# Patient Record
Sex: Female | Born: 1995
Health system: Southern US, Community
[De-identification: ages and names within clinical notes are randomized; demographics above are authoritative.]

## PROBLEM LIST (undated history)

## (undated) DIAGNOSIS — R011 Cardiac murmur, unspecified: Secondary | ICD-10-CM

## (undated) DIAGNOSIS — I498 Other specified cardiac arrhythmias: Secondary | ICD-10-CM

## (undated) DIAGNOSIS — Q898 Other specified congenital malformations: Secondary | ICD-10-CM

## (undated) DIAGNOSIS — Q870 Congenital malformation syndromes predominantly affecting facial appearance: Secondary | ICD-10-CM

## (undated) DIAGNOSIS — N12 Tubulo-interstitial nephritis, not specified as acute or chronic: Secondary | ICD-10-CM

## (undated) DIAGNOSIS — F419 Anxiety disorder, unspecified: Secondary | ICD-10-CM

## (undated) DIAGNOSIS — G901 Familial dysautonomia [Riley-Day]: Secondary | ICD-10-CM

## (undated) DIAGNOSIS — J189 Pneumonia, unspecified organism: Secondary | ICD-10-CM

## (undated) DIAGNOSIS — R55 Syncope and collapse: Secondary | ICD-10-CM

## (undated) DIAGNOSIS — H547 Unspecified visual loss: Secondary | ICD-10-CM

## (undated) DIAGNOSIS — R112 Nausea with vomiting, unspecified: Secondary | ICD-10-CM

## (undated) DIAGNOSIS — H9190 Unspecified hearing loss, unspecified ear: Secondary | ICD-10-CM

## (undated) DIAGNOSIS — G90A Postural orthostatic tachycardia syndrome (POTS): Secondary | ICD-10-CM

## (undated) DIAGNOSIS — R51 Headache: Secondary | ICD-10-CM

## (undated) DIAGNOSIS — G43909 Migraine, unspecified, not intractable, without status migrainosus: Secondary | ICD-10-CM

## (undated) DIAGNOSIS — R519 Headache, unspecified: Secondary | ICD-10-CM

## (undated) DIAGNOSIS — Z9889 Other specified postprocedural states: Secondary | ICD-10-CM

## (undated) DIAGNOSIS — M1990 Unspecified osteoarthritis, unspecified site: Secondary | ICD-10-CM

## (undated) HISTORY — DX: Familial dysautonomia (riley-day): G90.1

## (undated) HISTORY — DX: Postural orthostatic tachycardia syndrome (POTS): G90.A

## (undated) HISTORY — DX: Other specified cardiac arrhythmias: I49.8

## (undated) HISTORY — PX: TYMPANOSTOMY TUBE PLACEMENT: SHX32

## (undated) HISTORY — PX: CLEFT PALATE REPAIR: SUR1165

## (undated) HISTORY — DX: Unspecified visual loss: H54.7

## (undated) HISTORY — DX: Unspecified hearing loss, unspecified ear: H91.90

## (undated) HISTORY — DX: Syncope and collapse: R55

---
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
## 2016-03-17 DIAGNOSIS — I77810 Thoracic aortic ectasia: Secondary | ICD-10-CM | POA: Diagnosis not present

## 2016-03-17 DIAGNOSIS — Z3A31 31 weeks gestation of pregnancy: Secondary | ICD-10-CM | POA: Diagnosis not present

## 2016-03-17 DIAGNOSIS — Z3A32 32 weeks gestation of pregnancy: Secondary | ICD-10-CM | POA: Diagnosis not present

## 2016-03-17 DIAGNOSIS — O99413 Diseases of the circulatory system complicating pregnancy, third trimester: Secondary | ICD-10-CM | POA: Diagnosis not present

## 2016-03-17 DIAGNOSIS — D649 Anemia, unspecified: Secondary | ICD-10-CM | POA: Diagnosis not present

## 2016-03-17 DIAGNOSIS — O99013 Anemia complicating pregnancy, third trimester: Secondary | ICD-10-CM | POA: Diagnosis not present

## 2016-03-17 DIAGNOSIS — O365930 Maternal care for other known or suspected poor fetal growth, third trimester, not applicable or unspecified: Secondary | ICD-10-CM | POA: Diagnosis not present

## 2016-03-17 DIAGNOSIS — O352XX0 Maternal care for (suspected) hereditary disease in fetus, not applicable or unspecified: Secondary | ICD-10-CM | POA: Diagnosis not present

## 2016-03-17 DIAGNOSIS — Q898 Other specified congenital malformations: Secondary | ICD-10-CM | POA: Diagnosis not present

## 2016-04-03 DIAGNOSIS — O352XX0 Maternal care for (suspected) hereditary disease in fetus, not applicable or unspecified: Secondary | ICD-10-CM | POA: Diagnosis not present

## 2016-04-03 DIAGNOSIS — O09299 Supervision of pregnancy with other poor reproductive or obstetric history, unspecified trimester: Secondary | ICD-10-CM | POA: Diagnosis not present

## 2016-04-03 DIAGNOSIS — O368130 Decreased fetal movements, third trimester, not applicable or unspecified: Secondary | ICD-10-CM | POA: Diagnosis not present

## 2016-04-03 DIAGNOSIS — O210 Mild hyperemesis gravidarum: Secondary | ICD-10-CM | POA: Diagnosis not present

## 2016-04-03 DIAGNOSIS — Z3A34 34 weeks gestation of pregnancy: Secondary | ICD-10-CM | POA: Diagnosis not present

## 2016-04-03 DIAGNOSIS — Q898 Other specified congenital malformations: Secondary | ICD-10-CM | POA: Diagnosis not present

## 2016-04-03 DIAGNOSIS — Z8751 Personal history of pre-term labor: Secondary | ICD-10-CM | POA: Diagnosis not present

## 2016-04-03 DIAGNOSIS — I77810 Thoracic aortic ectasia: Secondary | ICD-10-CM | POA: Diagnosis not present

## 2016-04-03 DIAGNOSIS — O99213 Obesity complicating pregnancy, third trimester: Secondary | ICD-10-CM | POA: Diagnosis not present

## 2016-04-03 DIAGNOSIS — Z3A33 33 weeks gestation of pregnancy: Secondary | ICD-10-CM | POA: Diagnosis not present

## 2016-04-08 DIAGNOSIS — Z3A35 35 weeks gestation of pregnancy: Secondary | ICD-10-CM | POA: Diagnosis not present

## 2016-04-14 DIAGNOSIS — F329 Major depressive disorder, single episode, unspecified: Secondary | ICD-10-CM | POA: Diagnosis not present

## 2016-04-14 DIAGNOSIS — I77810 Thoracic aortic ectasia: Secondary | ICD-10-CM | POA: Diagnosis not present

## 2016-04-14 DIAGNOSIS — O365930 Maternal care for other known or suspected poor fetal growth, third trimester, not applicable or unspecified: Secondary | ICD-10-CM | POA: Diagnosis not present

## 2016-04-14 DIAGNOSIS — O09299 Supervision of pregnancy with other poor reproductive or obstetric history, unspecified trimester: Secondary | ICD-10-CM | POA: Diagnosis not present

## 2016-04-14 DIAGNOSIS — O99343 Other mental disorders complicating pregnancy, third trimester: Secondary | ICD-10-CM | POA: Diagnosis not present

## 2016-04-14 DIAGNOSIS — Z3A36 36 weeks gestation of pregnancy: Secondary | ICD-10-CM | POA: Diagnosis not present

## 2016-04-14 DIAGNOSIS — Q898 Other specified congenital malformations: Secondary | ICD-10-CM | POA: Diagnosis not present

## 2016-04-14 DIAGNOSIS — O0993 Supervision of high risk pregnancy, unspecified, third trimester: Secondary | ICD-10-CM | POA: Diagnosis not present

## 2016-04-14 DIAGNOSIS — Z8751 Personal history of pre-term labor: Secondary | ICD-10-CM | POA: Diagnosis not present

## 2016-04-18 DIAGNOSIS — Z3490 Encounter for supervision of normal pregnancy, unspecified, unspecified trimester: Secondary | ICD-10-CM | POA: Diagnosis not present

## 2016-04-18 DIAGNOSIS — Z3A36 36 weeks gestation of pregnancy: Secondary | ICD-10-CM | POA: Diagnosis not present

## 2016-04-18 DIAGNOSIS — O4703 False labor before 37 completed weeks of gestation, third trimester: Secondary | ICD-10-CM | POA: Diagnosis not present

## 2016-04-18 DIAGNOSIS — O219 Vomiting of pregnancy, unspecified: Secondary | ICD-10-CM | POA: Diagnosis not present

## 2016-04-18 DIAGNOSIS — R000 Tachycardia, unspecified: Secondary | ICD-10-CM | POA: Diagnosis not present

## 2016-04-18 DIAGNOSIS — O9989 Other specified diseases and conditions complicating pregnancy, childbirth and the puerperium: Secondary | ICD-10-CM | POA: Diagnosis not present

## 2016-04-25 DIAGNOSIS — Q898 Other specified congenital malformations: Secondary | ICD-10-CM | POA: Diagnosis not present

## 2016-04-25 DIAGNOSIS — Z3A38 38 weeks gestation of pregnancy: Secondary | ICD-10-CM | POA: Diagnosis not present

## 2016-04-25 DIAGNOSIS — O358XX0 Maternal care for other (suspected) fetal abnormality and damage, not applicable or unspecified: Secondary | ICD-10-CM | POA: Diagnosis not present

## 2016-04-25 DIAGNOSIS — I77810 Thoracic aortic ectasia: Secondary | ICD-10-CM | POA: Diagnosis not present

## 2016-04-25 DIAGNOSIS — O9989 Other specified diseases and conditions complicating pregnancy, childbirth and the puerperium: Secondary | ICD-10-CM | POA: Diagnosis not present

## 2016-04-25 DIAGNOSIS — Z3A37 37 weeks gestation of pregnancy: Secondary | ICD-10-CM | POA: Diagnosis not present

## 2016-04-25 DIAGNOSIS — M546 Pain in thoracic spine: Secondary | ICD-10-CM | POA: Diagnosis not present

## 2016-04-25 DIAGNOSIS — O9942 Diseases of the circulatory system complicating childbirth: Secondary | ICD-10-CM | POA: Diagnosis not present

## 2016-06-05 DIAGNOSIS — H6692 Otitis media, unspecified, left ear: Secondary | ICD-10-CM | POA: Diagnosis not present

## 2016-06-05 DIAGNOSIS — Z6835 Body mass index (BMI) 35.0-35.9, adult: Secondary | ICD-10-CM | POA: Diagnosis not present

## 2016-06-05 DIAGNOSIS — N61 Mastitis without abscess: Secondary | ICD-10-CM | POA: Diagnosis not present

## 2016-06-06 DIAGNOSIS — N61 Mastitis without abscess: Secondary | ICD-10-CM | POA: Diagnosis not present

## 2016-06-11 HISTORY — PX: INTRAUTERINE DEVICE INSERTION: SHX323

## 2016-06-12 DIAGNOSIS — H35413 Lattice degeneration of retina, bilateral: Secondary | ICD-10-CM | POA: Diagnosis not present

## 2016-06-12 DIAGNOSIS — Q159 Congenital malformation of eye, unspecified: Secondary | ICD-10-CM | POA: Diagnosis not present

## 2016-06-12 DIAGNOSIS — Q898 Other specified congenital malformations: Secondary | ICD-10-CM | POA: Diagnosis not present

## 2016-06-20 DIAGNOSIS — Z30430 Encounter for insertion of intrauterine contraceptive device: Secondary | ICD-10-CM | POA: Diagnosis not present

## 2016-06-27 DIAGNOSIS — Z6836 Body mass index (BMI) 36.0-36.9, adult: Secondary | ICD-10-CM | POA: Diagnosis not present

## 2016-06-27 DIAGNOSIS — N390 Urinary tract infection, site not specified: Secondary | ICD-10-CM | POA: Diagnosis not present

## 2016-06-28 ENCOUNTER — Encounter (HOSPITAL_COMMUNITY): Payer: Self-pay | Admitting: *Deleted

## 2016-06-28 DIAGNOSIS — A409 Streptococcal sepsis, unspecified: Principal | ICD-10-CM | POA: Diagnosis present

## 2016-06-28 DIAGNOSIS — Z91018 Allergy to other foods: Secondary | ICD-10-CM

## 2016-06-28 DIAGNOSIS — R109 Unspecified abdominal pain: Secondary | ICD-10-CM | POA: Diagnosis not present

## 2016-06-28 DIAGNOSIS — N12 Tubulo-interstitial nephritis, not specified as acute or chronic: Secondary | ICD-10-CM | POA: Diagnosis not present

## 2016-06-28 DIAGNOSIS — D649 Anemia, unspecified: Secondary | ICD-10-CM | POA: Diagnosis present

## 2016-06-28 DIAGNOSIS — Z87730 Personal history of (corrected) cleft lip and palate: Secondary | ICD-10-CM | POA: Diagnosis not present

## 2016-06-28 DIAGNOSIS — Z9102 Food additives allergy status: Secondary | ICD-10-CM | POA: Diagnosis not present

## 2016-06-28 DIAGNOSIS — Z975 Presence of (intrauterine) contraceptive device: Secondary | ICD-10-CM

## 2016-06-28 DIAGNOSIS — R1030 Lower abdominal pain, unspecified: Secondary | ICD-10-CM | POA: Diagnosis not present

## 2016-06-28 DIAGNOSIS — A419 Sepsis, unspecified organism: Secondary | ICD-10-CM | POA: Diagnosis not present

## 2016-06-28 DIAGNOSIS — N10 Acute tubulo-interstitial nephritis: Secondary | ICD-10-CM | POA: Diagnosis not present

## 2016-06-28 NOTE — ED Triage Notes (Signed)
Patient presents stating she is 9 months post partum and has been having lower abd pain for 2 weeks.  Seen by MD yesterday and dx with UTI.  Today started with fever, strong smelling urine.  SO reports she was not acting right today and talking out of her mind at times.  Also had an IUD placed a couple of weeks ago

## 2016-06-29 ENCOUNTER — Emergency Department (HOSPITAL_COMMUNITY): Payer: 59

## 2016-06-29 ENCOUNTER — Inpatient Hospital Stay (HOSPITAL_COMMUNITY)
Admission: EM | Admit: 2016-06-29 | Discharge: 2016-07-03 | DRG: 872 | Disposition: A | Discharge status: Home / Self Care | Payer: 59 | Attending: Internal Medicine | Admitting: Internal Medicine

## 2016-06-29 ENCOUNTER — Encounter (HOSPITAL_COMMUNITY): Payer: Self-pay | Admitting: Radiology

## 2016-06-29 DIAGNOSIS — A419 Sepsis, unspecified organism: Secondary | ICD-10-CM

## 2016-06-29 DIAGNOSIS — N10 Acute tubulo-interstitial nephritis: Secondary | ICD-10-CM | POA: Diagnosis present

## 2016-06-29 DIAGNOSIS — R109 Unspecified abdominal pain: Secondary | ICD-10-CM

## 2016-06-29 DIAGNOSIS — N12 Tubulo-interstitial nephritis, not specified as acute or chronic: Secondary | ICD-10-CM | POA: Diagnosis present

## 2016-06-29 DIAGNOSIS — A409 Streptococcal sepsis, unspecified: Secondary | ICD-10-CM

## 2016-06-29 HISTORY — DX: Migraine, unspecified, not intractable, without status migrainosus: G43.909

## 2016-06-29 HISTORY — DX: Tubulo-interstitial nephritis, not specified as acute or chronic: N12

## 2016-06-29 HISTORY — DX: Headache, unspecified: R51.9

## 2016-06-29 HISTORY — DX: Anxiety disorder, unspecified: F41.9

## 2016-06-29 HISTORY — DX: Pneumonia, unspecified organism: J18.9

## 2016-06-29 HISTORY — DX: Other specified postprocedural states: Z98.890

## 2016-06-29 HISTORY — DX: Nausea with vomiting, unspecified: R11.2

## 2016-06-29 HISTORY — DX: Unspecified osteoarthritis, unspecified site: M19.90

## 2016-06-29 HISTORY — DX: Other specified congenital malformations: Q89.8

## 2016-06-29 HISTORY — DX: Headache: R51

## 2016-06-29 LAB — URINALYSIS, ROUTINE W REFLEX MICROSCOPIC
Bacteria, UA: NONE SEEN
Bilirubin Urine: NEGATIVE
GLUCOSE, UA: NEGATIVE mg/dL
Hgb urine dipstick: NEGATIVE
KETONES UR: NEGATIVE mg/dL
NITRITE: NEGATIVE
PH: 5 (ref 5.0–8.0)
PROTEIN: 30 mg/dL — AB
Specific Gravity, Urine: 1.016 (ref 1.005–1.030)

## 2016-06-29 LAB — CBC WITH DIFFERENTIAL/PLATELET
Basophils Absolute: 0 10*3/uL (ref 0.0–0.1)
Basophils Relative: 0 %
EOS PCT: 1 %
Eosinophils Absolute: 0.1 10*3/uL (ref 0.0–0.7)
HCT: 51.4 % — ABNORMAL HIGH (ref 36.0–46.0)
Hemoglobin: 16.9 g/dL — ABNORMAL HIGH (ref 12.0–15.0)
LYMPHS ABS: 2.9 10*3/uL (ref 0.7–4.0)
Lymphocytes Relative: 24 %
MCH: 27.7 pg (ref 26.0–34.0)
MCHC: 32.9 g/dL (ref 30.0–36.0)
MCV: 84.1 fL (ref 78.0–100.0)
MONOS PCT: 9 %
Monocytes Absolute: 1.1 10*3/uL — ABNORMAL HIGH (ref 0.1–1.0)
Neutro Abs: 8.2 10*3/uL — ABNORMAL HIGH (ref 1.7–7.7)
Neutrophils Relative %: 66 %
Platelets: 230 10*3/uL (ref 150–400)
RBC: 6.11 MIL/uL — AB (ref 3.87–5.11)
RDW: 19.9 % — ABNORMAL HIGH (ref 11.5–15.5)
WBC: 12.3 10*3/uL — AB (ref 4.0–10.5)

## 2016-06-29 LAB — COMPREHENSIVE METABOLIC PANEL
ALT: 21 U/L (ref 14–54)
AST: 19 U/L (ref 15–41)
Albumin: 3.9 g/dL (ref 3.5–5.0)
Alkaline Phosphatase: 72 U/L (ref 38–126)
Anion gap: 13 (ref 5–15)
BUN: 6 mg/dL (ref 6–20)
CO2: 23 mmol/L (ref 22–32)
CREATININE: 0.95 mg/dL (ref 0.44–1.00)
Calcium: 8.8 mg/dL — ABNORMAL LOW (ref 8.9–10.3)
Chloride: 100 mmol/L — ABNORMAL LOW (ref 101–111)
GFR calc non Af Amer: >60 mL/min (ref >60)
Glucose, Bld: 94 mg/dL (ref 65–99)
Potassium: 3.5 mmol/L (ref 3.5–5.1)
SODIUM: 136 mmol/L (ref 135–145)
Total Bilirubin: 0.5 mg/dL (ref 0.3–1.2)
Total Protein: 7.9 g/dL (ref 6.5–8.1)

## 2016-06-29 LAB — I-STAT TROPONIN, ED: Troponin i, poc: 0 ng/mL (ref 0.00–0.08)

## 2016-06-29 LAB — I-STAT CG4 LACTIC ACID, ED: Lactic Acid, Venous: 1.62 mmol/L (ref 0.5–1.9)

## 2016-06-29 LAB — LIPASE, BLOOD: LIPASE: 21 U/L (ref 11–51)

## 2016-06-29 LAB — PROTIME-INR
INR: 1.14
Prothrombin Time: 14.7 seconds (ref 11.4–15.2)

## 2016-06-29 LAB — PREGNANCY, URINE: Preg Test, Ur: NEGATIVE

## 2016-06-29 MED ORDER — SODIUM CHLORIDE 0.9 % IV BOLUS (SEPSIS)
1000.0000 mL | Freq: Once | INTRAVENOUS | Status: AC
  Administered 2016-06-29: 1000 mL via INTRAVENOUS

## 2016-06-29 MED ORDER — ONDANSETRON HCL 4 MG/2ML IJ SOLN
4.0000 mg | Freq: Four times a day (QID) | INTRAMUSCULAR | Status: DC | PRN
  Administered 2016-06-29 – 2016-07-01 (×2): 4 mg via INTRAVENOUS
  Filled 2016-06-29 (×2): qty 2

## 2016-06-29 MED ORDER — LEVOFLOXACIN IN D5W 750 MG/150ML IV SOLN
750.0000 mg | Freq: Once | INTRAVENOUS | Status: AC
  Administered 2016-06-29: 750 mg via INTRAVENOUS
  Filled 2016-06-29: qty 150

## 2016-06-29 MED ORDER — FENTANYL CITRATE (PF) 100 MCG/2ML IJ SOLN
25.0000 ug | Freq: Once | INTRAMUSCULAR | Status: AC
  Administered 2016-06-29: 25 ug via INTRAVENOUS
  Filled 2016-06-29: qty 2

## 2016-06-29 MED ORDER — ENSURE ENLIVE PO LIQD
237.0000 mL | Freq: Two times a day (BID) | ORAL | Status: DC
  Administered 2016-06-30: 237 mL via ORAL

## 2016-06-29 MED ORDER — SODIUM CHLORIDE 0.9 % IV SOLN
INTRAVENOUS | Status: DC
  Administered 2016-06-30 – 2016-07-02 (×3): via INTRAVENOUS
  Administered 2016-07-03: 1 mL via INTRAVENOUS

## 2016-06-29 MED ORDER — FENTANYL CITRATE (PF) 100 MCG/2ML IJ SOLN
25.0000 ug | INTRAMUSCULAR | Status: DC | PRN
  Administered 2016-06-29 – 2016-07-03 (×23): 25 ug via INTRAVENOUS
  Filled 2016-06-29 (×23): qty 2

## 2016-06-29 MED ORDER — ONDANSETRON HCL 4 MG PO TABS
4.0000 mg | ORAL_TABLET | Freq: Four times a day (QID) | ORAL | Status: DC | PRN
  Administered 2016-07-03: 4 mg via ORAL
  Filled 2016-06-29: qty 1

## 2016-06-29 MED ORDER — ACETAMINOPHEN 325 MG PO TABS
650.0000 mg | ORAL_TABLET | Freq: Once | ORAL | Status: AC
  Administered 2016-06-29: 650 mg via ORAL
  Filled 2016-06-29: qty 2

## 2016-06-29 MED ORDER — IOPAMIDOL (ISOVUE-300) INJECTION 61%
INTRAVENOUS | Status: AC
  Filled 2016-06-29: qty 100

## 2016-06-29 MED ORDER — ACETAMINOPHEN 650 MG RE SUPP: 650.0000 mg | Freq: Four times a day (QID) | RECTAL | Status: DC | PRN

## 2016-06-29 MED ORDER — PRENATAL MULTIVITAMIN CH
1.0000 | ORAL_TABLET | Freq: Every day | ORAL | Status: DC
  Administered 2016-06-29 – 2016-07-02 (×4): 1 via ORAL
  Filled 2016-06-29 (×5): qty 1

## 2016-06-29 MED ORDER — CEFEPIME HCL 2 G IJ SOLR
2.0000 g | Freq: Once | INTRAMUSCULAR | Status: DC
  Filled 2016-06-29: qty 2

## 2016-06-29 MED ORDER — ACETAMINOPHEN 325 MG PO TABS: 650.0000 mg | ORAL_TABLET | Freq: Four times a day (QID) | ORAL | Status: DC | PRN

## 2016-06-29 MED ORDER — LEVOFLOXACIN IN D5W 750 MG/150ML IV SOLN: 750.0000 mg | INTRAVENOUS | Status: DC

## 2016-06-29 MED ORDER — ENOXAPARIN SODIUM 40 MG/0.4ML ~~LOC~~ SOLN
40.0000 mg | SUBCUTANEOUS | Status: DC
  Administered 2016-06-30 – 2016-07-02 (×3): 40 mg via SUBCUTANEOUS
  Filled 2016-06-29 (×3): qty 0.4

## 2016-06-29 MED ORDER — METRONIDAZOLE IN NACL 5-0.79 MG/ML-% IV SOLN
500.0000 mg | Freq: Once | INTRAVENOUS | Status: AC
  Administered 2016-06-29: 500 mg via INTRAVENOUS
  Filled 2016-06-29: qty 100

## 2016-06-29 MED ORDER — SODIUM CHLORIDE 0.9 % IV SOLN
INTRAVENOUS | Status: DC
  Administered 2016-06-29 (×2): via INTRAVENOUS

## 2016-06-29 MED ORDER — DEXTROSE 5 % IV SOLN
1.0000 g | Freq: Three times a day (TID) | INTRAVENOUS | Status: DC
  Administered 2016-06-29 – 2016-07-02 (×9): 1 g via INTRAVENOUS
  Filled 2016-06-29 (×10): qty 1

## 2016-06-29 NOTE — Progress Notes (Signed)
fentanyl (SUBLIMAZE) injection 75 mcg wasted at sink in patient's room [phone])

## 2016-06-29 NOTE — Progress Notes (Signed)
Pharmacy Antibiotic Note  Kaitlyn Moreno is a 21 y.o. female admitted on 06/29/2016 with UTI dx yesterday and r/o abd infection.  Pharmacy has been consulted for Levaquin dosing.  Plan: Levaquin  IV q24h Will f/u micro data, renal function, and pt's clinical condition   Height:  (157.5 cm) Weight: 200 lb (90.7 kg) IBW/kg (Calculated) : 50.1  Temp (24hrs), Avg:101.5 F (38.6 C), Min:99.1 F (37.3 C), Max:104.1 F (40.1 C)   Recent Labs Lab 06/28/16 2358 06/29/16 0041  WBC 12.3*  --   CREATININE 0.95  --   LATICACIDVEN  --  1.62    Estimated Creatinine Clearance: 98.9 mL/min (by C-G formula based on SCr of 0.95 mg/dL).    Allergies  Allergen Reactions  . Strawberry Extract Swelling  . Kiwi Extract   . Red Dye Rash    Antimicrobials this admission: 4/19 Flagyl x 1 4/19 Levaquin >>   Microbiology results: 4/18 BCx x2:  4/18 UCx:    Thank you for allowing pharmacy to be a part of this patient's care.  Christoper Fabian, PharmD, BCPS Clinical pharmacist, pager [phone] 06/29/2016 3:25 AM

## 2016-06-29 NOTE — ED Notes (Signed)
Called main lab to add on lipase.  

## 2016-06-29 NOTE — ED Provider Notes (Signed)
[facility]-EMERGENCY DEPT Provider Note   CSN: [id number] Arrival date & time: 06/28/16  2330   By signing my name below, I, Kaitlyn Moreno, attest that this documentation has been prepared under the direction and in the presence of Kaitlyn Rhine, MD. Electronically signed, Kaitlyn Moreno, ED Scribe. 06/29/16. 12:57 AM.   History   Chief Complaint Chief Complaint  Patient presents with  . Abdominal Pain  . Fever   The history is provided by the patient and medical records. No language interpreter was used.  Abdominal Pain   This is a new problem. The current episode started 2 days ago. The problem occurs constantly. The problem has been gradually worsening. The pain is associated with an unknown factor. The quality of the pain is throbbing. The pain is at a severity of 8/10. The pain is moderate. Associated symptoms include fever, dysuria, frequency and headaches. Pertinent negatives include hematuria. The symptoms are aggravated by certain positions. Nothing relieves the symptoms.  Fever   Associated symptoms include headaches. Pertinent negatives include no cough.    Kaitlyn Moreno is a 21 y.o. female with h/o sticklers disease, cleft palate, murmur, pierre robin sequence, transported via private vehicle to the Emergency Department with concern for gradually worsening abdominal pain onset 2 days ago. She notes associated fever (tMax 104 measured in [facility] ED), back pain, dizziness, dysuria, frequency, flank pain and headache. She states back, flank and abdominal pain are most concerning at this time. Pt describes 8/10, constant, throbbing abdominal pain radiating into her flank and back. No other modifying factors noted. Pt diagnosed with a UTI 2 days ago and prescribed Cipro at that time. IUD placed on 06/20/2016. Pt reportedly delivered a child vaginally 2 months ago, states her child is in ICU in [place] and she is currently pumping, not breastfeeding. She denies vaginal bleeding or discharge,  cough, foreign travel, rash, h/o chest or abdominal surgeries or any other complaints at this time.   Past Medical History:  Diagnosis Date  . Murmur   . [name]   . Stickler's syndrome     There are no active problems to display for this patient.   Past Surgical History:  Procedure Laterality Date  . CLEFT PALATE REPAIR    . tubes in ears      OB History    No data available       Home Medications    Prior to Admission medications   Medication Sig Start Date End Date Taking? Authorizing Provider  ciprofloxacin (CIPRO) 500 MG tablet Take 500 mg by mouth 2 (two) times daily.   Yes Historical Provider, MD  Prenatal Vit-Fe Fumarate-FA (PRENATAL MULTIVITAMIN) TABS tablet Take 1 tablet by mouth daily at 12 noon.   Yes Historical Provider, MD  ibuprofen (ADVIL,MOTRIN) 600 MG tablet Take 1 tablet (600 mg total) by mouth every 6 (six) hours as needed. Patient not taking: Reported on 06/29/2016 03/11/13   Marcellina Millin, MD    Family History No family history on file.  Social History Social History  Substance Use Topics  . Smoking status: Never Smoker  . Smokeless tobacco: Never Used  . Alcohol use No     Allergies   Strawberry extract; Kiwi extract; and Red dye   Review of Systems Review of Systems  Constitutional: Positive for fever.  Respiratory: Negative for cough.   Gastrointestinal: Positive for abdominal pain.  Genitourinary: Positive for dysuria, flank pain and frequency. Negative for hematuria, vaginal bleeding and vaginal discharge.  Musculoskeletal:  Positive for back pain.  Neurological: Positive for headaches.  All other systems reviewed and are negative.    Physical Exam Updated Vital Signs BP 115/76 (BP Location: Left Arm)   Pulse (!) 134   Temp (!) 104.1 F (40.1 C) (Oral)   Resp 18   Ht  (1.575 m)   Wt 200 lb (90.7 kg)   LMP 06/14/2016   SpO2 97%   BMI 36.58 kg/m   Physical Exam CONSTITUTIONAL: Well developed/well  nourished HEAD: Normocephalic/atraumatic EYES: EOMI/PERRL, no icterus ENMT: Mucous membranes moist NECK: supple no meningeal signs SPINE/BACK:entire spine nontender CV: tachycardic but no loud murmurs LUNGS: Lungs are clear to auscultation bilaterally, no apparent distress ABDOMEN: soft, moderate diffuse tenderness, no rebound or guarding, bowel sounds noted throughout abdomen GU: bilateral cva tenderness NEURO: Pt is awake/alert/appropriate, moves all extremitiesx4.  No facial droop.   EXTREMITIES: pulses normal/equal, full ROM SKIN: warm, color normal, no rash PSYCH: no abnormalities of mood noted, alert and oriented to situation   ED Treatments / Results  DIAGNOSTIC STUDIES: Oxygen Saturation is 97% on RA, NL by my interpretation.    COORDINATION OF CARE: 12:42 AM-Discussed next steps with pt. Pt verbalized understanding and is agreeable with the plan. Will order labs, imaging and medications. 3:08 AM Pt requesting medications for pain. Will order fentanyl.   Labs (all labs ordered are listed, but only abnormal results are displayed) Labs Reviewed  COMPREHENSIVE METABOLIC PANEL - Abnormal; Notable for the following:       Result Value   Chloride 100 (*)    Calcium 8.8 (*)    All other components within normal limits  CBC WITH DIFFERENTIAL/PLATELET - Abnormal; Notable for the following:    WBC 12.3 (*)    RBC 6.11 (*)    Hemoglobin 16.9 (*)    HCT 51.4 (*)    RDW 19.9 (*)    Neutro Abs 8.2 (*)    Monocytes Absolute 1.1 (*)    All other components within normal limits  URINALYSIS, ROUTINE W REFLEX MICROSCOPIC - Abnormal; Notable for the following:    Color, Urine AMBER (*)    APPearance TURBID (*)    Protein, ur 30 (*)    Leukocytes, UA MODERATE (*)    Squamous Epithelial / LPF 0-5 (*)    Non Squamous Epithelial 0-5 (*)    All other components within normal limits  CULTURE, BLOOD (ROUTINE X 2)  CULTURE, BLOOD (ROUTINE X 2)  URINE CULTURE  PROTIME-INR  PREGNANCY,  URINE  LIPASE, BLOOD  I-STAT CG4 LACTIC ACID, ED  I-STAT TROPOININ, ED    EKG  EKG Interpretation None       Radiology Ct Abdomen Pelvis W Contrast  Result Date: 06/29/2016 CLINICAL DATA:  Initial evaluation for sepsis, worsening pain and fever. Recent UTI. EXAM: CT ABDOMEN AND PELVIS WITH CONTRAST TECHNIQUE: Multidetector CT imaging of the abdomen and pelvis was performed using the standard protocol following bolus administration of intravenous contrast. CONTRAST:  Under cc of Isovue-300. COMPARISON:  Prior CT from 07/23/2014. FINDINGS: Lower chest: Visualized lung bases are clear. Hepatobiliary: Liver demonstrates a normal contrast enhanced appearance. Gallbladder normal. No biliary dilatation. Pancreas: Pancreas within normal limits. Spleen: Spleen within normal limits. Adrenals/Urinary Tract: Adrenal glands are normal. Kidneys equal in size. Subtly decreased nephrogram within the right kidney as compared to the left. Parenchymal striation present at the lower pole. Subtle hazy stranding about the right renal pelvis and right ureter. Constellation of findings suspicious for upper urinary tract  infection with mild pyelonephritis. Relative sparing of the left kidney which is normal in appearance. No significant inflammatory changes about the left renal collecting system. No nephrolithiasis or hydronephrosis. No focal enhancing renal mass. Bladder partially distended without acute abnormality. Stomach/Bowel: Stomach within normal limits. No evidence for bowel obstruction. Appendix normal. No acute inflammatory changes about the bowels. Vascular/Lymphatic: Normal intravascular enhancement seen throughout the intra-abdominal aorta and its branch vessels. No pathologically enlarged intra-abdominal or pelvic lymph nodes. Reproductive: IUD in place within the uterus. Uterus and ovaries otherwise unremarkable. Other: Gy stasis of the rectus abdominis musculature with small fat containing paraumbilical  hernia. No free air or fluid. Musculoskeletal: No acute osseus abnormality. No worrisome lytic or blastic osseous lesions. IMPRESSION: 1. Subtle striation within the right renal nephrogram with hazy inflammatory stranding about the right renal collecting system. Findings concerning for acute pyelonephritis with right-sided upper urinary tract infection. Relative sparing of the left kidney and left renal collecting system at this time. 2. No other acute intra-abdominal or pelvic process. Electronically Signed   By: Rise Mu M.D.   On: 06/29/2016 02:15   Dg Chest Portable 1 View  Result Date: 06/29/2016 CLINICAL DATA:  Sepsis. EXAM: PORTABLE CHEST 1 VIEW COMPARISON:  Radiograph 12/14/2010 FINDINGS: There low lung volumes limit assessment. Streaky bibasilar atelectasis. No confluent airspace disease to suggest pneumonia. Heart is at the upper limits normal in size, likely accentuated by technique and low lung volumes. No pleural fluid. No pulmonary edema. No pneumothorax. No evidence of acute osseous abnormality. IMPRESSION: Hypoventilatory chest with streaky bibasilar atelectasis. Electronically Signed   By: Rubye Oaks M.D.   On: 06/29/2016 00:39    Procedures Procedures  CRITICAL CARE Performed by: Joya Gaskins Total critical care time: 35 minutes Critical care time was exclusive of separately billable procedures and treating other patients. Critical care was necessary to treat or prevent imminent or life-threatening deterioration. Critical care was time spent personally by me on the following activities: development of treatment plan with patient and/or surrogate as well as nursing, discussions with consultants, evaluation of patient's response to treatment, examination of patient, obtaining history from patient or surrogate, ordering and performing treatments and interventions, ordering and review of laboratory studies, ordering and review of radiographic studies, pulse oximetry  and re-evaluation of patient's condition. PATIENT WITH PYELONEPHRITIS, IV FLUIDS GIVEN, IV ANTIBIOTICS, INITIAL HEART RATE >130 WILL BE ADMITTED   Medications Ordered in ED Medications  iopamidol (ISOVUE-300) 61 % injection (not administered)  fentaNYL (SUBLIMAZE) injection 25 mcg (not administered)  sodium chloride 0.9 % bolus 1,000 mL (0 mLs Intravenous Stopped 06/29/16 0109)    And  sodium chloride 0.9 % bolus 1,000 mL (0 mLs Intravenous Stopped 06/29/16 0224)    And  sodium chloride 0.9 % bolus 1,000 mL (1,000 mLs Intravenous New Bag/Given 06/29/16 0224)  levofloxacin (LEVAQUIN) IVPB 750 mg (0 mg Intravenous Stopped 06/29/16 0224)  metroNIDAZOLE (FLAGYL) IVPB 500 mg (0 mg Intravenous Stopped 06/29/16 0224)  acetaminophen (TYLENOL) tablet 650 mg (650 mg Oral Given 06/29/16 0111)  fentaNYL (SUBLIMAZE) injection 25 mcg (25 mcg Intravenous Given 06/29/16 0131)     Initial Impression / Assessment and Plan / ED Course  I have reviewed the triage vital signs and the nursing notes.  Pertinent labs & imaging results that were available during my care of the patient were reviewed by me and considered in my medical decision making (see chart for details).     1:07 AM Pt 2 months postpartum here with abd pain  Recent diagnosis  Of UTI, just started antibiotics However she had IUD placed earlier this month Pt will need CT imaging 3:19 AM PT IMPROVED CT IMAGING REVEALS PYELONEPHRITIS BUT NO OTHER ACUTE FINDINGS DUE TO HER ILL APPEARANCE ON ARRIVAL, WILL ADMIT FOR MONITORING PT AGREEABLE D/W DR Julian Reil FOR ADMISSION Final Clinical Impressions(s) / ED Diagnoses   Final diagnoses:  Pyelonephritis    New Prescriptions New Prescriptions   No medications on file  I personally performed the services described in this documentation, which was scribed in my presence. The recorded information has been reviewed and is accurate.        Kaitlyn Rhine, MD 06/29/16 [phone]

## 2016-06-29 NOTE — Care Management Note (Signed)
Case Management Note  Patient Details  Name: RADA ZEGERS MRN: [id number] Date of Birth: 1995/10/21  Subjective/Objective:                    Action/Plan:   Expected Discharge Date:                  Expected Discharge Plan:  Home/Self Care  In-House Referral:     Discharge planning Services     Post Acute Care Choice:    Choice offered to:     DME Arranged:    DME Agency:     HH Arranged:    HH Agency:     Status of Service:  In process, will continue to follow  If discussed at Long Length of Stay Meetings, dates discussed:    Additional Comments:  Kingsley Plan, RN 06/29/2016, 2:46 PM

## 2016-06-29 NOTE — Progress Notes (Signed)
Pharmacy Antibiotic Note  Kaitlyn Moreno is a 21 y.o. female admitted on 06/29/2016 with abdominal pain and fever. Pharmacy has been consulted to transition patient from Levaquin to cefepime for pyelonephritis/UTI.  Baseline labs reviewed.   Plan: - Cefepime 1gm IV Q8H - Monitor renal fxn, micro data, clinical progress to de-escalate abx   Height:  (157.5 cm) Weight: 200 lb (90.7 kg) IBW/kg (Calculated) : 50.1  Temp (24hrs), Avg:100.6 F (38.1 C), Min:98 F (36.7 C), Max:104.1 F (40.1 C)   Recent Labs Lab 06/28/16 2358 06/29/16 0041  WBC 12.3*  --   CREATININE 0.95  --   LATICACIDVEN  --  1.62    Estimated Creatinine Clearance: 98.9 mL/min (by C-G formula based on SCr of 0.95 mg/dL).    Allergies  Allergen Reactions  . Strawberry Extract Swelling  . Kiwi Extract   . Red Dye Rash    LVQ 4/19 >> 4/19 Cefepime 4/19 >>  4/18 BCx x2 - 4/18 UCx -     Chidiebere Wynn D. Laney Potash, PharmD, BCPS Pager:  [phone] 06/29/2016, 10:12 AM

## 2016-06-29 NOTE — H&P (Addendum)
History and Physical    Kaitlyn Moreno VHQ:[id number] DOB: 1995/04/15 DOA: 06/29/2016  PCP: No PCP Per Patient Patient coming from: Home  Chief Complaint: abdominal pain and fever  HPI: Kaitlyn Moreno is a 21 y.o. female with medical history significant of Polo Riley Sequence, Stickler's syndrome presenting w/ abdominal pain and fevers. Started 2-3 days ago. Initially intermittent but now constant. Associated with flank pain, dizziness, dysuria, frequency and headaches. Pain is described as throbbing and achy. Patient states she was diagnosed with a UTI approximately 2 days ago and was started on ciprofloxacin. Edition she had an IUD placed on 06/20/2016 patient is status post spontaneous vaginal delivery 2 months ago. Child is currently in the ICU at [facility]. Patient denies any vaginal bleeding or discharge, cough, shortness of breath, chest pain, palpitations, rash, diarrhea, constipation.  Additionally patient endorses confusion she states she has very little memory of the events over the course of the evening and her time here in the emergency room. Patient states she was told by her husband that she was very confused and saying odd things. Currently her mental status is at baseline  ED Course: objective findings outlined below. Sepsis protocol initiated with fluids and antibiotics  Review of Systems: As per HPI otherwise 10 point review of systems negative.   Ambulatory Status: no restrictions  Past Medical History:  Diagnosis Date  . Murmur   . Polo Riley sequence   . Stickler's syndrome     Past Surgical History:  Procedure Laterality Date  . CLEFT PALATE REPAIR    . tubes in ears      Social History   Social History  . Marital status: Single    Spouse name: N/A  . Number of children: N/A  . Years of education: N/A   Occupational History  . Not on file.   Social History Main Topics  . Smoking status: Never Smoker  . Smokeless tobacco: Never Used  . Alcohol  use No  . Drug use: No  . Sexual activity: Yes    Birth control/ protection: Other-see comments     Comment: IUD placed   Other Topics Concern  . Not on file   Social History Narrative  . No narrative on file    Allergies  Allergen Reactions  . Strawberry Extract Swelling  . Kiwi Extract   . Red Dye Rash    Family History  Problem Relation Age of Onset  . Diabetes Mother   . Stickler syndrome Father     Prior to Admission medications   Medication Sig Start Date End Date Taking? Authorizing Provider  ciprofloxacin (CIPRO) 500 MG tablet Take 500 mg by mouth 2 (two) times daily.   Yes Historical Provider, MD  Prenatal Vit-Fe Fumarate-FA (PRENATAL MULTIVITAMIN) TABS tablet Take 1 tablet by mouth daily at 12 noon.   Yes Historical Provider, MD  ibuprofen (ADVIL,MOTRIN) 600 MG tablet Take 1 tablet (600 mg total) by mouth every 6 (six) hours as needed. Patient not taking: Reported on 06/29/2016 03/11/13   Isaac Bliss, MD    Physical Exam: Vitals:   06/29/16 0600 06/29/16 0739 06/29/16 0800 06/29/16 0938  BP: 98/64 95/72 101/64 104/60  Pulse: 74 69 75 84  Resp: _0 Temp:    98 F (36.7 C)  TempSrc:    Oral  SpO2: 97% 100% 100% 100%  Weight:      Height:         General:  Appears calm and  comfortable Eyes:  PERRL, EOMI, normal lids, iris ENT:  grossly normal hearing, lips & tongue, mmm Neck:  no LAD, masses or thyromegaly Cardiovascular:  RRR, no m/r/g. No LE edema.  Respiratory:  CTA bilaterally, no w/r/r. Normal respiratory effort. Abdomen: Mild discomfort on palpation especially in suprapubic region, NABS Skin:  no rash or induration seen on limited exam Musculoskeletal:  grossly normal tone BUE/BLE, good ROM, no bony abnormality Psychiatric:  grossly normal mood and affect, speech fluent and appropriate, AOx3 Neurologic:  CN 2-12 grossly intact, moves all extremities in coordinated fashion, sensation intact  Labs on Admission: I have personally reviewed  following labs and imaging studies  CBC:  Recent Labs Lab 06/28/16 2358  WBC 12.3*  NEUTROABS 8.2*  HGB 16.9*  HCT 51.4*  MCV 84.1  PLT 329   Basic Metabolic Panel:  Recent Labs Lab 06/28/16 2358  NA 136  K 3.5  CL 100*  CO2 23  GLUCOSE 94  BUN 6  CREATININE 0.95  CALCIUM 8.8*   GFR: Estimated Creatinine Clearance: 98.9 mL/min (by C-G formula based on SCr of 0.95 mg/dL). Liver Function Tests:  Recent Labs Lab 06/28/16 2358  AST 19  ALT 21  ALKPHOS 72  BILITOT 0.5  PROT 7.9  ALBUMIN 3.9    Recent Labs Lab 06/28/16 2358  LIPASE 21   No results for input(s): AMMONIA in the last 168 hours. Coagulation Profile:  Recent Labs Lab 06/28/16 2358  INR 1.14   Cardiac Enzymes: No results for input(s): CKTOTAL, CKMB, CKMBINDEX, TROPONINI in the last 168 hours. BNP (last 3 results) No results for input(s): PROBNP in the last 8760 hours. HbA1C: No results for input(s): HGBA1C in the last 72 hours. CBG: No results for input(s): GLUCAP in the last 168 hours. Lipid Profile: No results for input(s): CHOL, HDL, LDLCALC, TRIG, CHOLHDL, LDLDIRECT in the last 72 hours. Thyroid Function Tests: No results for input(s): TSH, T4TOTAL, FREET4, T3FREE, THYROIDAB in the last 72 hours. Anemia Panel: No results for input(s): VITAMINB12, FOLATE, FERRITIN, TIBC, IRON, RETICCTPCT in the last 72 hours. Urine analysis:    Component Value Date/Time   COLORURINE AMBER (A) 06/28/2016 2359   APPEARANCEUR TURBID (A) 06/28/2016 2359   LABSPEC 1.016 06/28/2016 2359   PHURINE 5.0 06/28/2016 2359   GLUCOSEU NEGATIVE 06/28/2016 2359   HGBUR NEGATIVE 06/28/2016 2359   BILIRUBINUR NEGATIVE 06/28/2016 [place] 06/28/2016 2359   PROTEINUR 30 (A) 06/28/2016 2359   NITRITE NEGATIVE 06/28/2016 2359   LEUKOCYTESUR MODERATE (A) 06/28/2016 2359    Creatinine Clearance: Estimated Creatinine Clearance: 98.9 mL/min (by C-G formula based on SCr of 0.95 mg/dL).  Sepsis  Labs: _0 (procalcitonin:4,lacticidven:4) )No results found for this or any previous visit (from the past 240 hour(s)).   Radiological Exams on Admission: Ct Abdomen Pelvis W Contrast  Result Date: 06/29/2016 CLINICAL DATA:  Initial evaluation for sepsis, worsening pain and fever. Recent UTI. EXAM: CT ABDOMEN AND PELVIS WITH CONTRAST TECHNIQUE: Multidetector CT imaging of the abdomen and pelvis was performed using the standard protocol following bolus administration of intravenous contrast. CONTRAST:  Under cc of Isovue-300. COMPARISON:  Prior CT from 07/23/2014. FINDINGS: Lower chest: Visualized lung bases are clear. Hepatobiliary: Liver demonstrates a normal contrast enhanced appearance. Gallbladder normal. No biliary dilatation. Pancreas: Pancreas within normal limits. Spleen: Spleen within normal limits. Adrenals/Urinary Tract: Adrenal glands are normal. Kidneys equal in size. Subtly decreased nephrogram within the right kidney as compared to the left. Parenchymal striation present at the lower pole.  Subtle hazy stranding about the right renal pelvis and right ureter. Constellation of findings suspicious for upper urinary tract infection with mild pyelonephritis. Relative sparing of the left kidney which is normal in appearance. No significant inflammatory changes about the left renal collecting system. No nephrolithiasis or hydronephrosis. No focal enhancing renal mass. Bladder partially distended without acute abnormality. Stomach/Bowel: Stomach within normal limits. No evidence for bowel obstruction. Appendix normal. No acute inflammatory changes about the bowels. Vascular/Lymphatic: Normal intravascular enhancement seen throughout the intra-abdominal aorta and its branch vessels. No pathologically enlarged intra-abdominal or pelvic lymph nodes. Reproductive: IUD in place within the uterus. Uterus and ovaries otherwise unremarkable. Other: Gy stasis of the rectus abdominis musculature with small  fat containing paraumbilical hernia. No free air or fluid. Musculoskeletal: No acute osseus abnormality. No worrisome lytic or blastic osseous lesions. IMPRESSION: 1. Subtle striation within the right renal nephrogram with hazy inflammatory stranding about the right renal collecting system. Findings concerning for acute pyelonephritis with right-sided upper urinary tract infection. Relative sparing of the left kidney and left renal collecting system at this time. 2. No other acute intra-abdominal or pelvic process. Electronically Signed   By: Jeannine Boga M.D.   On: 06/29/2016 02:15   Dg Chest Portable 1 View  Result Date: 06/29/2016 CLINICAL DATA:  Sepsis. EXAM: PORTABLE CHEST 1 VIEW COMPARISON:  Radiograph 12/14/2010 FINDINGS: There low lung volumes limit assessment. Streaky bibasilar atelectasis. No confluent airspace disease to suggest pneumonia. Heart is at the upper limits normal in size, likely accentuated by technique and low lung volumes. No pleural fluid. No pulmonary edema. No pneumothorax. No evidence of acute osseous abnormality. IMPRESSION: Hypoventilatory chest with streaky bibasilar atelectasis. Electronically Signed   By: Jeb Levering M.D.   On: 06/29/2016 00:39      Assessment/Plan Principal Problem:   Pyelonephritis Active Problems:   Streptococcal sepsis, unspecified ([place])   Postpartum state   Abdominal pain   Sepsis ([place])   Pyelonephritis: Treated for UTI on cipro 2 days prior to admission.  CVA tenderness present. - change to cefepime until Cx/sensitivities return. Unfortunately may come back negative as pt on cipro at time of Cx collection - IVF  Sepsis: secondary to the above. Currently resolved. Pt initially met sepsis criteria w/ AMS, WBC 12.3, temperature 104.1, tachycardic, tachypnea, with floridly positive UA. Currently patient mentation is at baseline and vital signs have improved significantly after IV antibiotics and IV fluids. - Treatment as above.    - f/u [facility]  Lactating Mother: Pt is 57mopostpartum. SVD at 38 wks. Pts [facility] Mother continuing to pump breast milk - Pump to room - Continue PNV  Abdominal pain: Likely from pyelo. Placement of IUD 1 wk ago l;ikely not contributing as no vaginal sx and CT shows proper placement of IUD. Much improved since time of admission - treatment as above.    DVT prophylaxis: Lovenox (pt is currently in a hypercoagulable state and at higher risk of DVT) Code Status: FULL  Family Communication: none  Disposition Plan: pending improvement and [place]  Admission status: observation    Donyetta Ogletree J MD [facility]  If 7PM-7AM, please contact night-coverage [email or web address] Password [email or web address] 06/29/2016, 9:41 AM

## 2016-06-29 NOTE — Progress Notes (Signed)
On call MD notified via Amion regarding patient's elevated temperature 101.76F. Patient encourage to use incentive spirometer.

## 2016-06-30 DIAGNOSIS — N12 Tubulo-interstitial nephritis, not specified as acute or chronic: Secondary | ICD-10-CM

## 2016-06-30 DIAGNOSIS — Z975 Presence of (intrauterine) contraceptive device: Secondary | ICD-10-CM | POA: Diagnosis not present

## 2016-06-30 DIAGNOSIS — N10 Acute tubulo-interstitial nephritis: Secondary | ICD-10-CM | POA: Diagnosis not present

## 2016-06-30 DIAGNOSIS — R1030 Lower abdominal pain, unspecified: Secondary | ICD-10-CM | POA: Diagnosis not present

## 2016-06-30 DIAGNOSIS — Z87730 Personal history of (corrected) cleft lip and palate: Secondary | ICD-10-CM | POA: Diagnosis not present

## 2016-06-30 DIAGNOSIS — A409 Streptococcal sepsis, unspecified: Secondary | ICD-10-CM | POA: Diagnosis present

## 2016-06-30 DIAGNOSIS — Z91018 Allergy to other foods: Secondary | ICD-10-CM | POA: Diagnosis not present

## 2016-06-30 DIAGNOSIS — D649 Anemia, unspecified: Secondary | ICD-10-CM | POA: Diagnosis not present

## 2016-06-30 DIAGNOSIS — Z9102 Food additives allergy status: Secondary | ICD-10-CM | POA: Diagnosis not present

## 2016-06-30 LAB — COMPREHENSIVE METABOLIC PANEL
ALBUMIN: 2.9 g/dL — AB (ref 3.5–5.0)
ALT: 20 U/L (ref 14–54)
ANION GAP: 10 (ref 5–15)
AST: 16 U/L (ref 15–41)
Alkaline Phosphatase: 54 U/L (ref 38–126)
BILIRUBIN TOTAL: 0.2 mg/dL — AB (ref 0.3–1.2)
BUN: <5 mg/dL — ABNORMAL LOW (ref 6–20)
CALCIUM: 8 mg/dL — AB (ref 8.9–10.3)
CO2: 23 mmol/L (ref 22–32)
CREATININE: 0.81 mg/dL (ref 0.44–1.00)
Chloride: 104 mmol/L (ref 101–111)
GFR calc non Af Amer: >60 mL/min (ref >60)
GLUCOSE: 87 mg/dL (ref 65–99)
Potassium: 3.7 mmol/L (ref 3.5–5.1)
Sodium: 137 mmol/L (ref 135–145)
TOTAL PROTEIN: 6.1 g/dL — AB (ref 6.5–8.1)

## 2016-06-30 LAB — URINE CULTURE

## 2016-06-30 MED ORDER — SACCHAROMYCES BOULARDII 250 MG PO CAPS
250.0000 mg | ORAL_CAPSULE | Freq: Two times a day (BID) | ORAL | Status: DC
  Administered 2016-06-30 – 2016-07-03 (×5): 250 mg via ORAL
  Filled 2016-06-30 (×5): qty 1

## 2016-06-30 MED ORDER — BOOST / RESOURCE BREEZE PO LIQD
1.0000 | Freq: Two times a day (BID) | ORAL | Status: DC
  Administered 2016-06-30 – 2016-07-02 (×3): 1 via ORAL

## 2016-06-30 MED ORDER — LOPERAMIDE HCL 2 MG PO CAPS
4.0000 mg | ORAL_CAPSULE | Freq: Once | ORAL | Status: AC
  Administered 2016-06-30: 4 mg via ORAL
  Filled 2016-06-30: qty 2

## 2016-06-30 NOTE — Progress Notes (Signed)
Patient has had 3 loose stools in the past few hours. MD made aware. Immodium and Florastor to be ordered for the patient. RN will continue to monitor.

## 2016-06-30 NOTE — Progress Notes (Signed)
Initial Nutrition Assessment  DOCUMENTATION CODES:   Obesity unspecified  INTERVENTION:   -Boost Breeze po BID, each supplement provides 250 kcal and 9 grams of protein  NUTRITION DIAGNOSIS:   Increased nutrient needs related to  (lactation) as evidenced by estimated needs.  GOAL:   Patient will meet greater than or equal to 90% of their needs  MONITOR:   PO intake, Supplement acceptance, Labs, Weight trends, Skin, I & O's  REASON FOR ASSESSMENT:   Malnutrition Screening Tool    ASSESSMENT:   21 year old Caucasian female with past medical history of Sticklers syndrome presented with complaints of abdominal pain, fever, or urinary frequency. She was diagnosed with a urinary tract infection 2 days prior to hospitalization and was started on ciprofloxacin. She had an IUD placed on April 10. She is status post spontaneous vaginal delivery about 2 months ago and her son is in the ICU at [facility] since he has the same syndrome that she has. Since patient's symptoms did not improve, she was hospitalized for further management.  Pt admitted with acute pyelonephritis.   Spoke with pt, who reports poor appetite over the past two months since her son was born. She shares that she has had a lack of desire to eat. Additionally, pt shares that she suffered from hyperemesis during her pregnancy and lost 40# as a result of nausea, vomiting, and the inability to keep foods down. She reports this resulted in multiple ED visits during her pregnancy for IV fluids and anti-emetics, however, was never hospitalized during her pregnancy. Pt suspects she has lost weight over the past two months, more related to poor oral intake than post-partum.   Pt was consuming lunch at time of visit- ate 100% of sandwich. Pt reports "this is the first time I've felt like eating anything in a while". She was consuming a sandwich without difficulty. Ensure supplement at bedside- pt reports sipping on it due to its milky  consistency. Since pt is currently pumping, will add Boost Breeze supplement to assist with additional calories and protein, which pt is amenable to.   Nutrition-Focused physical exam completed. Findings are no fat depletion, no muscle depletion, and no edema.   Labs reviewed.   Diet Order:  Diet regular Room service appropriate? Yes; Fluid consistency: Thin  Skin:  Reviewed, no issues  Last BM:  06/28/16  Height:   Ht Readings from Last 1 Encounters:  06/28/16  (1.575 m)    Weight:   Wt Readings from Last 1 Encounters:  06/28/16 200 lb (90.7 kg)    Ideal Body Weight:  50 kg  BMI:  Body mass index is 36.58 kg/m.  Estimated Nutritional Needs:   Kcal:  1800-2000  Protein:  75-90 grams  Fluid:  >1.9 L  EDUCATION NEEDS:   No education needs identified at this time  Marcell Chavarin A. Mayford Knife, RD, LDN, CDE Pager: [phone] After hours Pager: [phone]

## 2016-06-30 NOTE — Progress Notes (Signed)
[facility] PROGRESS NOTE  SARYE KATH WVP:[id number] DOB: 1996/02/09 DOA: 06/29/2016  PCP: Marco Collie, MD  Brief History/Interval Summary: 21 year old Caucasian female with past medical history of Sticklers syndrome presented with complaints of abdominal pain, fever, or urinary frequency. She was diagnosed with a urinary tract infection 2 days prior to hospitalization and was started on ciprofloxacin. She had an IUD placed on April 10. She is status post spontaneous vaginal delivery about 2 months ago and her son is in the ICU at [facility] since he has the same syndrome that she has. Since patient's symptoms did not improve, she was hospitalized for further management.  Reason for Visit: Acute pyelonephritis  Consultants: None  Procedures: None  Antibiotics: Cefepime  Subjective/Interval History: Patient feels better this morning. Continues to have pain in her lower abdomen and right flank area. Denies any nausea, vomiting. Continues to have some discomfort with urination. Denies any blood in the urine.  ROS: No chest pain or shortness of breath  Objective:  Vital Signs  Vitals:   06/29/16 2200 06/30/16 0021 06/30/16 0451 06/30/16 0500  BP: 112/69  (!) 80/47 (!) 91/57  Pulse:   95   Resp:   19   Temp: (!) 101.5 F (38.6 C) 100 F (37.8 C) 98.7 F (37.1 C)   TempSrc: Oral Oral Oral   SpO2:   100%   Weight:      Height:        Intake/Output Summary (Last 24 hours) at 06/30/16 1325 Last data filed at 06/30/16 0944  Gross per 24 hour  Intake              360 ml  Output                5 ml  Net              355 ml   Filed Weights   06/28/16 2347  Weight: 90.7 kg (200 lb)    General appearance: alert, cooperative, appears stated age and no distress Resp: clear to auscultation bilaterally Cardio: regular rate and rhythm, S1, S2 normal, no murmur, click, rub or gallop GI: Abdomen is soft. Mildly tender in the left lower abdomen and the right flank area. No  rebound, rigidity or guarding. No masses or organomegaly.  Extremities: extremities normal, atraumatic, no cyanosis or edema Neurologic: No focal neurologic deficits.  Lab Results:  Data Reviewed: I have personally reviewed following labs and imaging studies  CBC:  Recent Labs Lab 06/28/16 2358  WBC 12.3*  NEUTROABS 8.2*  HGB 16.9*  HCT 51.4*  MCV 84.1  PLT 546    Basic Metabolic Panel:  Recent Labs Lab 06/28/16 2358 06/30/16 0551  NA 136 137  K 3.5 3.7  CL 100* 104  CO2 23 23  GLUCOSE 94 87  BUN 6 <5*  CREATININE 0.95 0.81  CALCIUM 8.8* 8.0*    GFR: Estimated Creatinine Clearance: 116 mL/min (by C-G formula based on SCr of 0.81 mg/dL).  Liver Function Tests:  Recent Labs Lab 06/28/16 2358 06/30/16 0551  AST 19 16  ALT 21 20  ALKPHOS 72 54  BILITOT 0.5 0.2*  PROT 7.9 6.1*  ALBUMIN 3.9 2.9*     Recent Labs Lab 06/28/16 2358  LIPASE 21   Coagulation Profile:  Recent Labs Lab 06/28/16 2358  INR 1.14     Recent Results (from the past 240 hour(s))  Urine culture     Status: Abnormal   Collection Time: 06/28/16 11:59  PM  Result Value Ref Range Status   Specimen Description URINE, RANDOM  Final   Special Requests NONE  Final   Culture MULTIPLE SPECIES PRESENT, SUGGEST RECOLLECTION (A)  Final   Report Status 06/30/2016 FINAL  Final      Radiology Studies: Ct Abdomen Pelvis W Contrast  Result Date: 06/29/2016 CLINICAL DATA:  Initial evaluation for sepsis, worsening pain and fever. Recent UTI. EXAM: CT ABDOMEN AND PELVIS WITH CONTRAST TECHNIQUE: Multidetector CT imaging of the abdomen and pelvis was performed using the standard protocol following bolus administration of intravenous contrast. CONTRAST:  Under cc of Isovue-300. COMPARISON:  Prior CT from 07/23/2014. FINDINGS: Lower chest: Visualized lung bases are clear. Hepatobiliary: Liver demonstrates a normal contrast enhanced appearance. Gallbladder normal. No biliary dilatation. Pancreas:  Pancreas within normal limits. Spleen: Spleen within normal limits. Adrenals/Urinary Tract: Adrenal glands are normal. Kidneys equal in size. Subtly decreased nephrogram within the right kidney as compared to the left. Parenchymal striation present at the lower pole. Subtle hazy stranding about the right renal pelvis and right ureter. Constellation of findings suspicious for upper urinary tract infection with mild pyelonephritis. Relative sparing of the left kidney which is normal in appearance. No significant inflammatory changes about the left renal collecting system. No nephrolithiasis or hydronephrosis. No focal enhancing renal mass. Bladder partially distended without acute abnormality. Stomach/Bowel: Stomach within normal limits. No evidence for bowel obstruction. Appendix normal. No acute inflammatory changes about the bowels. Vascular/Lymphatic: Normal intravascular enhancement seen throughout the intra-abdominal aorta and its branch vessels. No pathologically enlarged intra-abdominal or pelvic lymph nodes. Reproductive: IUD in place within the uterus. Uterus and ovaries otherwise unremarkable. Other: Gy stasis of the rectus abdominis musculature with small fat containing paraumbilical hernia. No free air or fluid. Musculoskeletal: No acute osseus abnormality. No worrisome lytic or blastic osseous lesions. IMPRESSION: 1. Subtle striation within the right renal nephrogram with hazy inflammatory stranding about the right renal collecting system. Findings concerning for acute pyelonephritis with right-sided upper urinary tract infection. Relative sparing of the left kidney and left renal collecting system at this time. 2. No other acute intra-abdominal or pelvic process. Electronically Signed   By: Jeannine Boga M.D.   On: 06/29/2016 02:15   Dg Chest Portable 1 View  Result Date: 06/29/2016 CLINICAL DATA:  Sepsis. EXAM: PORTABLE CHEST 1 VIEW COMPARISON:  Radiograph 12/14/2010 FINDINGS: There low lung  volumes limit assessment. Streaky bibasilar atelectasis. No confluent airspace disease to suggest pneumonia. Heart is at the upper limits normal in size, likely accentuated by technique and low lung volumes. No pleural fluid. No pulmonary edema. No pneumothorax. No evidence of acute osseous abnormality. IMPRESSION: Hypoventilatory chest with streaky bibasilar atelectasis. Electronically Signed   By: Jeb Levering M.D.   On: 06/29/2016 00:39     Medications:  Scheduled: . enoxaparin (LOVENOX) injection  40 mg Subcutaneous Q24H  . feeding supplement (ENSURE ENLIVE)  237 mL Oral BID BM  . prenatal multivitamin  1 tablet Oral Q1200   Continuous: . sodium chloride 75 mL/hr at 06/30/16 1317  . ceFEPime (MAXIPIME) IV 1 g (06/30/16 1314)   SKA:[id number] **OR** acetaminophen, fentaNYL (SUBLIMAZE) injection, ondansetron **OR** ondansetron (ZOFRAN) IV  Assessment/Plan:  Principal Problem:   Pyelonephritis Active Problems:   Streptococcal sepsis, unspecified ([place])   Postpartum state   Abdominal pain   Sepsis ([place])    Acute Pyelonephritis Patient was treated for a urinary tract infection with ciprofloxacin. This was started about 2 days prior to admission. I did call her physician's  office (Dr. Marco Collie). Unfortunately, they did not have enough urine to send for cultures. Urine cultures have been sent here. We await results. Continue cefepime for now. Patient appears to be slowly improving.   Abdominal pain This is most likely due to the pyelonephritis. CT scan suggested the same. Placement of IUD 1 wk ago likely not contributing as no vaginal symptoms and CT shows proper placement of IUD.   Sepsis Secondary to the above. Currently resolved. Lactic acid level was normal. Check labs tomorrow. Patient initially met sepsis criteria w/ AMS, WBC 12.3, temperature 104.1, tachycardic, tachypnea, with floridly positive UA. Mental status is now back to baseline. Continue IV antibiotics. Cut  back on IV fluids. Follow up culture data.   Lactating Mother Patient is 40mopostpartum. [facility] Mother continuing to pump breast milk.   DVT Prophylaxis: Lovenox    Code Status: Full code  Family Communication: Discussed with the patient  Disposition Plan: Management as outlined above. Mobilize as tolerated.   LOS: 0 days   [facility] MayHospitalists Pager [phone]/20/2018, 1:25 PM  If 7PM-7AM, please contact night-coverage at [email or web address], password [facility]

## 2016-06-30 NOTE — Consult Note (Signed)
   [facility] CM Inpatient Consult   06/30/2016  Kaitlyn Moreno 03-Oct-1995 [id number]    Came to visit Kaitlyn Moreno on behalf of [facility] Care Management/ Link to Wellness program for [employer] employees/dependents with [facility] insurance. She states her mother is a Producer, television/film/video. Discussed Link to Wellness program. Denies having any Link to Wellness needs. Confirms Primary Care Provider is Dr. Abner Greenspan.   Support and encouragement given as KaitlynDaugherty reports her son in ICU at [facility]. Reports both her newborn and 37 year old son have the same syndrome as she has. Discussed that she will receive post hospital discharge call. Confirmed best contact number as [phone]. Appreciative of visit.    Raiford Noble, MSN-Ed, RN,BSN [facility] Liaison [phone]

## 2016-07-01 DIAGNOSIS — N10 Acute tubulo-interstitial nephritis: Secondary | ICD-10-CM

## 2016-07-01 DIAGNOSIS — D649 Anemia, unspecified: Secondary | ICD-10-CM

## 2016-07-01 LAB — BASIC METABOLIC PANEL
ANION GAP: 9 (ref 5–15)
BUN: 5 mg/dL — AB (ref 6–20)
CO2: 24 mmol/L (ref 22–32)
Calcium: 8.1 mg/dL — ABNORMAL LOW (ref 8.9–10.3)
Chloride: 104 mmol/L (ref 101–111)
Creatinine, Ser: 0.73 mg/dL (ref 0.44–1.00)
GFR calc Af Amer: >60 mL/min (ref >60)
GLUCOSE: 106 mg/dL — AB (ref 65–99)
POTASSIUM: 3.7 mmol/L (ref 3.5–5.1)
Sodium: 137 mmol/L (ref 135–145)

## 2016-07-01 LAB — CBC
HEMATOCRIT: 30.8 % — AB (ref 36.0–46.0)
Hemoglobin: 9.7 g/dL — ABNORMAL LOW (ref 12.0–15.0)
MCH: 26.9 pg (ref 26.0–34.0)
MCHC: 31.5 g/dL (ref 30.0–36.0)
MCV: 85.6 fL (ref 78.0–100.0)
PLATELETS: 286 10*3/uL (ref 150–400)
RBC: 3.6 MIL/uL — AB (ref 3.87–5.11)
RDW: 19.7 % — ABNORMAL HIGH (ref 11.5–15.5)
WBC: 7.7 10*3/uL (ref 4.0–10.5)

## 2016-07-01 MED ORDER — DIPHENHYDRAMINE HCL 25 MG PO CAPS
25.0000 mg | ORAL_CAPSULE | Freq: Three times a day (TID) | ORAL | Status: DC | PRN
  Administered 2016-07-01: 25 mg via ORAL
  Filled 2016-07-01 (×2): qty 1

## 2016-07-01 MED ORDER — OXYCODONE-ACETAMINOPHEN 5-325 MG PO TABS
1.0000 | ORAL_TABLET | ORAL | Status: DC | PRN
  Administered 2016-07-01 (×2): 2 via ORAL
  Filled 2016-07-01 (×2): qty 2

## 2016-07-01 NOTE — Progress Notes (Signed)
 [facility] PROGRESS NOTE  Kaitlyn Moreno MRN:[id number] DOB: 08/20/1995 DOA: 06/29/2016  PCP: HODGES,BETH, MD  Brief History/Interval Summary: 21-year-old Caucasian female with past medical history of Sticklers syndrome presented with complaints of abdominal pain, fever, or urinary frequency. She was diagnosed with a urinary tract infection 2 days prior to hospitalization and was started on ciprofloxacin. She had an IUD placed on April 10. She is status post spontaneous vaginal delivery about 2 months ago and her son is in the ICU at [facility] since he has the same syndrome as patient. Since patient's symptoms did not improve, she was hospitalized for further management.  Reason for Visit: Acute pyelonephritis  Consultants: None  Procedures: None  Antibiotics: Cefepime  Subjective/Interval History: Patient continues to slowly improve. Still experiencing pain in the right flank area. Some nausea but no vomiting. Tolerating her diet.   ROS: No chest pain or shortness of breath  Objective:  Vital Signs  Vitals:   06/30/16 0500 06/30/16 1453 06/30/16 2002 07/01/16 0452  BP: (!) 91/57 (!) 95/55 (!) 95/56 (!) 90/51  Pulse:  74 82 72  Resp:  18 17 18  Temp:  98.1 F (36.7 C) 98.7 F (37.1 C) 98.6 F (37 C)  TempSrc:  Oral Oral Oral  SpO2:  100% 99% 99%  Weight:      Height:        Intake/Output Summary (Last 24 hours) at 07/01/16 0946 Last data filed at 07/01/16 0500  Gross per 24 hour  Intake          2178.75 ml  Output                5 ml  Net          2173.75 ml   Filed Weights   06/28/16 2347  Weight: 90.7 kg (200 lb)    General appearance: Awake, alert. No distress. Resp: Clear to auscultation bilaterally. Cardio: S1, S2 is normal, regular. No S3, S4. No rubs, murmurs, or bruit. GI: Abdomen is soft. Mildly tender in the lower abdomen. Right CVA tenderness is present. No rebound, rigidity, guarding in the abdomen. No masses or organomegaly. Bowel sounds  are present. Extremities: No edema Neurologic: No focal neurological deficits.  Lab Results:  Data Reviewed: I have personally reviewed following labs and imaging studies  CBC:  Recent Labs Lab 06/28/16 2358 07/01/16 0603  WBC 12.3* 7.7  NEUTROABS 8.2*  --   HGB 16.9* 9.7*  HCT 51.4* 30.8*  MCV 84.1 85.6  PLT 230 286    Basic Metabolic Panel:  Recent Labs Lab 06/28/16 2358 06/30/16 0551 07/01/16 0603  NA 136 137 137  K 3.5 3.7 3.7  CL 100* 104 104  CO2 23 23 24  GLUCOSE 94 87 106*  BUN 6 <5* 5*  CREATININE 0.95 0.81 0.73  CALCIUM 8.8* 8.0* 8.1*    GFR: Estimated Creatinine Clearance: 117.4 mL/min (by C-G formula based on SCr of 0.73 mg/dL).  Liver Function Tests:  Recent Labs Lab 06/28/16 2358 06/30/16 0551  AST 19 16  ALT 21 20  ALKPHOS 72 54  BILITOT 0.5 0.2*  PROT 7.9 6.1*  ALBUMIN 3.9 2.9*     Recent Labs Lab 06/28/16 2358  LIPASE 21   Coagulation Profile:  Recent Labs Lab 06/28/16 2358  INR 1.14     Recent Results (from the past 240 hour(s))  Urine culture     Status: Abnormal   Collection Time: 06/28/16 11:59 PM  Result Value Ref   Range Status   Specimen Description URINE, RANDOM  Final   Special Requests NONE  Final   Culture MULTIPLE SPECIES PRESENT, SUGGEST RECOLLECTION (A)  Final   Report Status 06/30/2016 FINAL  Final      Radiology Studies: No results found.   Medications:  Scheduled: . enoxaparin (LOVENOX) injection  40 mg Subcutaneous Q24H  . feeding supplement  1 Container Oral BID BM  . prenatal multivitamin  1 tablet Oral Q1200  . saccharomyces boulardii  250 mg Oral BID   Continuous: . sodium chloride 75 mL/hr at 06/30/16 1317  . ceFEPime (MAXIPIME) IV Stopped (07/01/16 3151)   VOH:YWVPXTGGYIRSW **OR** acetaminophen, fentaNYL (SUBLIMAZE) injection, ondansetron **OR** ondansetron (ZOFRAN) IV  Assessment/Plan:  Principal Problem:   Pyelonephritis Active Problems:   Streptococcal sepsis, unspecified  ([place])   Postpartum state   Abdominal pain   Sepsis ([place])   Acute pyelonephritis    Acute Pyelonephritis Patient was treated for a urinary tract infection with ciprofloxacin. This was started about 2 days prior to admission. I did call her physician's office (Dr. Marco Collie). Unfortunately, they did not have enough urine to send for cultures. Urine cultures sent here have not shown any specific organism. Repeating culture likely low yield considering that she has been on antibiotics for many days now. Continue cefepime for now. Await symptomatic improvement.   Abdominal pain This is most likely due to the pyelonephritis. CT scan suggested the same. Placement of IUD 1 wk ago likely not contributing as no vaginal symptoms and CT shows proper placement of IUD. Seems to be stable. Tolerating her diet. Mobilize.  Sepsis Secondary to the above. Currently resolved. Lactic acid level was normal. Patient initially met sepsis criteria w/ AMS, WBC 12.3, temperature 104.1, tachycardic, tachypnea, with floridly positive UA. Mental status is back to baseline. Continue IV antibiotics. Blood cultures are negative so far.  Normocytic Anemia Significant drop in hemoglobin is noted. This is primarily due to dilution, as she was hemoconcentrated at admission. No overt bleeding has been noted. Repeat hemoglobin tomorrow morning.  Lactating Mother Patient is 18mopostpartum. [facility] Mother continuing to pump breast milk , though this is being discarded.  DVT Prophylaxis: Lovenox    Code Status: Full code  Family Communication: Discussed with the patient  Disposition Plan: Management as outlined above. Mobilize.   LOS: 1 day   [facility] Pager [phone]/21/2018, 9:46 AM  If 7PM-7AM, please contact night-coverage at [email or web address], password [email or web address]

## 2016-07-02 LAB — BASIC METABOLIC PANEL
Anion gap: 6 (ref 5–15)
BUN: 5 mg/dL — AB (ref 6–20)
CALCIUM: 8.2 mg/dL — AB (ref 8.9–10.3)
CO2: 28 mmol/L (ref 22–32)
CREATININE: 0.73 mg/dL (ref 0.44–1.00)
Chloride: 106 mmol/L (ref 101–111)
Glucose, Bld: 81 mg/dL (ref 65–99)
Potassium: 4.3 mmol/L (ref 3.5–5.1)
SODIUM: 140 mmol/L (ref 135–145)

## 2016-07-02 LAB — CBC
HCT: 29.9 % — ABNORMAL LOW (ref 36.0–46.0)
Hemoglobin: 9.7 g/dL — ABNORMAL LOW (ref 12.0–15.0)
MCH: 27.9 pg (ref 26.0–34.0)
MCHC: 32.4 g/dL (ref 30.0–36.0)
MCV: 85.9 fL (ref 78.0–100.0)
PLATELETS: 289 10*3/uL (ref 150–400)
RBC: 3.48 MIL/uL — ABNORMAL LOW (ref 3.87–5.11)
RDW: 19.8 % — AB (ref 11.5–15.5)
WBC: 7 10*3/uL (ref 4.0–10.5)

## 2016-07-02 MED ORDER — LEVOFLOXACIN 750 MG PO TABS
750.0000 mg | ORAL_TABLET | Freq: Every day | ORAL | Status: DC
  Administered 2016-07-02 – 2016-07-03 (×2): 750 mg via ORAL
  Filled 2016-07-02 (×2): qty 1

## 2016-07-02 MED ORDER — HYDROCODONE-ACETAMINOPHEN 5-325 MG PO TABS
1.0000 | ORAL_TABLET | ORAL | Status: DC | PRN
  Administered 2016-07-02 – 2016-07-03 (×4): 2 via ORAL
  Administered 2016-07-03: 1 via ORAL
  Filled 2016-07-02 (×3): qty 2
  Filled 2016-07-02: qty 1
  Filled 2016-07-02: qty 2

## 2016-07-02 NOTE — Progress Notes (Signed)
[facility] PROGRESS NOTE  Kaitlyn Moreno YWV:[id number] DOB: November 27, 1995 DOA: 06/29/2016  PCP: Marco Collie, MD  Brief History/Interval Summary: 21 year old Caucasian female with past medical history of Sticklers syndrome presented with complaints of abdominal pain, fever, or urinary frequency. She was diagnosed with a urinary tract infection 2 days prior to hospitalization and was started on ciprofloxacin. She had an IUD placed on April 10. She is status post spontaneous vaginal delivery about 2 months ago and her son is in the ICU at [facility] since he has the same syndrome as patient. Since patient's symptoms did not improve, she was hospitalized for further management.  Reason for Visit: Acute pyelonephritis  Consultants: None  Procedures: None  Antibiotics: Cefepime  Subjective/Interval History: Patient continues to have pain in her right flank area. Not so much pain in her abdomen. Some nausea as present but no vomiting. Ambulating. Tolerating her diet.   ROS: No chest pain or shortness of breath  Objective:  Vital Signs  Vitals:   07/01/16 1350 07/01/16 2034 07/02/16 0429 07/02/16 0902  BP: 101/63  (!) 92/48 (!) 99/57  Pulse: 71 63 77   Resp: '18 18 18   '$ Temp: 98.4 F (36.9 C) 97.7 F (36.5 C) 98.7 F (37.1 C)   TempSrc: Oral Oral Oral   SpO2: 96% 100% 100%   Weight:      Height:        Intake/Output Summary (Last 24 hours) at 07/02/16 0932 Last data filed at 07/02/16 0323  Gross per 24 hour  Intake          2064.75 ml  Output                0 ml  Net          2064.75 ml   Filed Weights   06/28/16 2347  Weight: 90.7 kg (200 lb)    General appearance: Awake, alert. No distress. Resp: Clear to auscultation bilaterally. Cardio: S1, S2 is normal, regular. No S3, S4. No rubs, murmurs, or bruit. GI: Abdomen remains soft. Mildly tender in the lower abdomen, especially in the right flank area. Mild right CVA tenderness is present. No rebound, rigidity or  guarding. No masses or organomegaly.  Extremities: No edema Neurologic: No focal neurological deficits.  Lab Results:  Data Reviewed: I have personally reviewed following labs and imaging studies  CBC:  Recent Labs Lab 06/28/16 2358 07/01/16 0603 07/02/16 0617  WBC 12.3* 7.7 7.0  NEUTROABS 8.2*  --   --   HGB 16.9* 9.7* 9.7*  HCT 51.4* 30.8* 29.9*  MCV 84.1 85.6 85.9  PLT 230 286 854    Basic Metabolic Panel:  Recent Labs Lab 06/28/16 2358 06/30/16 0551 07/01/16 0603 07/02/16 0617  NA 136 137 137 140  K 3.5 3.7 3.7 4.3  CL 100* 104 104 106  CO2 '23 23 24 28  '$ GLUCOSE 94 87 106* 81  BUN 6 <5* 5* 5*  CREATININE 0.95 0.81 0.73 0.73  CALCIUM 8.8* 8.0* 8.1* 8.2*    GFR: Estimated Creatinine Clearance: 117.4 mL/min (by C-G formula based on SCr of 0.73 mg/dL).  Liver Function Tests:  Recent Labs Lab 06/28/16 2358 06/30/16 0551  AST 19 16  ALT 21 20  ALKPHOS 72 54  BILITOT 0.5 0.2*  PROT 7.9 6.1*  ALBUMIN 3.9 2.9*     Recent Labs Lab 06/28/16 2358  LIPASE 21   Coagulation Profile:  Recent Labs Lab 06/28/16 2358  INR 1.14  Recent Results (from the past 240 hour(s))  Culture, blood (Routine x 2)     Status: None (Preliminary result)   Collection Time: 06/28/16 11:58 PM  Result Value Ref Range Status   Specimen Description BLOOD LEFT ARM  Final   Special Requests   Final    BOTTLES DRAWN AEROBIC AND ANAEROBIC Blood Culture adequate volume   Culture NO GROWTH 2 DAYS  Final   Report Status PENDING  Incomplete  Urine culture     Status: Abnormal   Collection Time: 06/28/16 11:59 PM  Result Value Ref Range Status   Specimen Description URINE, RANDOM  Final   Special Requests NONE  Final   Culture MULTIPLE SPECIES PRESENT, SUGGEST RECOLLECTION (A)  Final   Report Status 06/30/2016 FINAL  Final  Culture, blood (Routine x 2)     Status: None (Preliminary result)   Collection Time: 06/29/16 12:08 AM  Result Value Ref Range Status   Specimen  Description BLOOD RIGHT ARM  Final   Special Requests IN PEDIATRIC BOTTLE Blood Culture adequate volume  Final   Culture NO GROWTH 2 DAYS  Final   Report Status PENDING  Incomplete      Radiology Studies: No results found.   Medications:  Scheduled: . enoxaparin (LOVENOX) injection  40 mg Subcutaneous Q24H  . feeding supplement  1 Container Oral BID BM  . levofloxacin  750 mg Oral Daily  . prenatal multivitamin  1 tablet Oral Q1200  . saccharomyces boulardii  250 mg Oral BID   Continuous: . sodium chloride 75 mL/hr at 07/01/16 1540   JHE:RDEYCXKGYJEHU **OR** acetaminophen, diphenhydrAMINE, fentaNYL (SUBLIMAZE) injection, HYDROcodone-acetaminophen, ondansetron **OR** ondansetron (ZOFRAN) IV  Assessment/Plan:  Principal Problem:   Pyelonephritis Active Problems:   Streptococcal sepsis, unspecified ([place])   Postpartum state   Abdominal pain   Sepsis ([place])   Acute pyelonephritis    Acute Pyelonephritis Patient was treated for a urinary tract infection with ciprofloxacin about 2 days prior to admission. I did call her physician's office (Dr. Marco Collie). Unfortunately, they did not have enough urine to send for cultures. Urine cultures sent here have not shown any specific organism. Repeating culture likely low yield considering that she has been on antibiotics for many days now. Blood cultures are without growth. Patient has been slow to improve with seems to be medically stable. WBCs normal. Change to oral Levaquin. Mobilize.   Abdominal pain This is most likely due to the pyelonephritis. CT scan suggested the same. Placement of IUD 1 wk ago likely not contributing as no vaginal symptoms and CT shows proper placement of IUD. Seems to be stable. Tolerating her diet. Mobilize.  Sepsis Secondary to the above. Currently resolved. Lactic acid level was normal. Patient initially met sepsis criteria w/ AMS, WBC 12.3, temperature 104.1, tachycardic, tachypnea, with floridly positive  UA. Mental status is back to baseline. Blood cultures are negative so far.  Normocytic Anemia Significant drop in hemoglobin is noted. This is primarily due to dilution, as she was hemoconcentrated at admission. No overt bleeding has been noted. Repeat hemoglobin this morning is stable.  Lactating Mother Patient is 9mopostpartum. [facility] Mother continuing to pump breast milk , though this is being discarded.  DVT Prophylaxis: Lovenox    Code Status: Full code  Family Communication: Discussed with the patient  Disposition Plan: Management as outlined above. Mobilize.   LOS: 2 days   [facility] Pager [phone]/22/2018, 9:32 AM  If 7PM-7AM, please contact night-coverage at [email or web address], password [email or web address]   

## 2016-07-03 MED ORDER — POLYETHYLENE GLYCOL 3350 17 G PO PACK: 17.0000 g | PACK | Freq: Every day | ORAL | 0 refills | Status: DC | PRN

## 2016-07-03 MED ORDER — LEVOFLOXACIN 750 MG PO TABS: 750.0000 mg | ORAL_TABLET | Freq: Every day | ORAL | 0 refills | Status: AC

## 2016-07-03 MED ORDER — ONDANSETRON 4 MG PO TBDP: 4.0000 mg | ORAL_TABLET | Freq: Three times a day (TID) | ORAL | 0 refills | Status: DC | PRN

## 2016-07-03 MED ORDER — HYDROCODONE-ACETAMINOPHEN 5-325 MG PO TABS: 1.0000 | ORAL_TABLET | ORAL | 0 refills | Status: DC | PRN

## 2016-07-03 MED ORDER — SACCHAROMYCES BOULARDII 250 MG PO CAPS: 250.0000 mg | ORAL_CAPSULE | Freq: Two times a day (BID) | ORAL | 0 refills | Status: DC

## 2016-07-03 NOTE — Care Management Note (Signed)
Case Management Note  Patient Details  Name: Kaitlyn Moreno MRN: [id number] Date of Birth: [birth year]  Subjective/Objective:                    Action/Plan:  No needs identified. Expected Discharge Date:  07/03/16               Expected Discharge Plan:  Home/Self Care  In-House Referral:     Discharge planning Services     Post Acute Care Choice:    Choice offered to:     DME Arranged:    DME Agency:     HH Arranged:    HH Agency:     Status of Service:  Completed, signed off  If discussed at [employer] of Stay Meetings, dates discussed:    Additional Comments:  Kingsley Plan, RN 07/03/2016, 11:35 AM

## 2016-07-03 NOTE — Discharge Summary (Signed)
[facility]  Physician Discharge Summary   Patient ID: Kaitlyn Moreno MRN: [id number] DOB/AGE: [birth year] 21 y.o.  Admit date: 06/29/2016 Discharge date: 07/03/2016  PCP: Marco Collie, MD  DISCHARGE DIAGNOSES:  Principal Problem:   Pyelonephritis Active Problems:   Streptococcal sepsis, unspecified ([place])   Postpartum state   Abdominal pain   Sepsis ([place])   Acute pyelonephritis   RECOMMENDATIONS FOR OUTPATIENT FOLLOW UP: 1. Patient instructed to follow-up with her primary care provider within 7 days   DISCHARGE CONDITION: fair  Diet recommendation: Regular as tolerated  Filed Weights   06/28/16 2347  Weight: 90.7 kg (200 lb)    INITIAL HISTORY: 21 year old Caucasian female with past medical history of Sticklers syndrome presented with complaints of abdominal pain, fever, or urinary frequency. She was diagnosed with a urinary tract infection 2 days prior to hospitalization and was started on ciprofloxacin. She had an IUD placed on April 10. She is status post spontaneous vaginal delivery about 2 months ago and her son is in the ICU at [facility] since he has the same syndrome as patient. Since patient's symptoms did not improve, she was hospitalized for further management.   HOSPITAL COURSE:   Acute Pyelonephritis Patient was treated for a urinary tract infection with ciprofloxacin about 2 days prior to admission. I did call her physician's office (Dr. Marco Collie). Unfortunately, they did not have enough urine to send for cultures. Urine cultures sent here have not shown any specific organism. Repeating culture likely low yield considering that she has been on antibiotics for many days now. Blood cultures are without growth. Patient was initially placed on cefepime. Patient was slow to improve. Continues to have symptoms of abdominal discomfort at times with nausea. Changed over to Levaquin which she has been tolerating well. WBCs normal. She has been afebrile. Rest of  the treatment can be continued at home.   Abdominal pain This is most likely due to the pyelonephritis. CT scan suggested the same. Placement of IUD 1 wk ago likely not contributing as no vaginal symptoms and CT shows proper placement of IUD.   Sepsis Secondary to the above. Currently resolved. Lactic acid level was normal. Patient initially met sepsis criteria w/ AMS, WBC 12.3, temperature 104.1, tachycardic, tachypnea, with floridly positive UA. Mental status is back to baseline. Blood cultures are negative so far. Blood pressure always runs low.  Normocytic Anemia Significant drop in hemoglobin is noted. This is primarily due to dilution, as she was hemoconcentrated at admission. No overt bleeding has been noted. Hemoglobin was repeated and was noted to be stable.  Lactating Mother Patient is 57mopostpartum. [facility] continuing to pump breast milk , though this is being discarded.  Overall improved. She has been ambulating without any difficulties. Rest of the recovery can occur at home. Okay for discharge.   PERTINENT LABS:  The results of significant diagnostics from this hospitalization (including imaging, microbiology, ancillary and laboratory) are listed below for reference.    Microbiology: Recent Results (from the past 240 hour(s))  Culture, blood (Routine x 2)     Status: None (Preliminary result)   Collection Time: 06/28/16 11:58 PM  Result Value Ref Range Status   Specimen Description BLOOD LEFT ARM  Final   Special Requests   Final    BOTTLES DRAWN AEROBIC AND ANAEROBIC Blood Culture adequate volume   Culture NO GROWTH 3 DAYS  Final   Report Status PENDING  Incomplete  Urine culture  Status: Abnormal   Collection Time: 06/28/16 11:59 PM  Result Value Ref Range Status   Specimen Description URINE, RANDOM  Final   Special Requests NONE  Final   Culture MULTIPLE SPECIES PRESENT, SUGGEST RECOLLECTION (A)  Final   Report  Status 06/30/2016 FINAL  Final  Culture, blood (Routine x 2)     Status: None (Preliminary result)   Collection Time: 06/29/16 12:08 AM  Result Value Ref Range Status   Specimen Description BLOOD RIGHT ARM  Final   Special Requests IN PEDIATRIC BOTTLE Blood Culture adequate volume  Final   Culture NO GROWTH 3 DAYS  Final   Report Status PENDING  Incomplete     Labs: Basic Metabolic Panel:  Recent Labs Lab 06/28/16 2358 06/30/16 0551 07/01/16 0603 07/02/16 0617  NA 136 137 137 140  K 3.5 3.7 3.7 4.3  CL 100* 104 104 106  CO2 23 23 24 28   GLUCOSE 94 87 106* 81  BUN 6 <5* 5* 5*  CREATININE 0.95 0.81 0.73 0.73  CALCIUM 8.8* 8.0* 8.1* 8.2*   Liver Function Tests:  Recent Labs Lab 06/28/16 2358 06/30/16 0551  AST 19 16  ALT 21 20  ALKPHOS 72 54  BILITOT 0.5 0.2*  PROT 7.9 6.1*  ALBUMIN 3.9 2.9*    Recent Labs Lab 06/28/16 2358  LIPASE 21   CBC:  Recent Labs Lab 06/28/16 2358 07/01/16 0603 07/02/16 0617  WBC 12.3* 7.7 7.0  NEUTROABS 8.2*  --   --   HGB 16.9* 9.7* 9.7*  HCT 51.4* 30.8* 29.9*  MCV 84.1 85.6 85.9  PLT 230 286 289     IMAGING STUDIES Ct Abdomen Pelvis W Contrast  Result Date: 06/29/2016 CLINICAL DATA:  Initial evaluation for sepsis, worsening pain and fever. Recent UTI. EXAM: CT ABDOMEN AND PELVIS WITH CONTRAST TECHNIQUE: Multidetector CT imaging of the abdomen and pelvis was performed using the standard protocol following bolus administration of intravenous contrast. CONTRAST:  Under cc of Isovue-300. COMPARISON:  Prior CT from 07/23/2014. FINDINGS: Lower chest: Visualized lung bases are clear. Hepatobiliary: Liver demonstrates a normal contrast enhanced appearance. Gallbladder normal. No biliary dilatation. Pancreas: Pancreas within normal limits. Spleen: Spleen within normal limits. Adrenals/Urinary Tract: Adrenal glands are normal. Kidneys equal in size. Subtly decreased nephrogram within the right kidney as compared to the left. Parenchymal  striation present at the lower pole. Subtle hazy stranding about the right renal pelvis and right ureter. Constellation of findings suspicious for upper urinary tract infection with mild pyelonephritis. Relative sparing of the left kidney which is normal in appearance. No significant inflammatory changes about the left renal collecting system. No nephrolithiasis or hydronephrosis. No focal enhancing renal mass. Bladder partially distended without acute abnormality. Stomach/Bowel: Stomach within normal limits. No evidence for bowel obstruction. Appendix normal. No acute inflammatory changes about the bowels. Vascular/Lymphatic: Normal intravascular enhancement seen throughout the intra-abdominal aorta and its branch vessels. No pathologically enlarged intra-abdominal or pelvic lymph nodes. Reproductive: IUD in place within the uterus. Uterus and ovaries otherwise unremarkable. Other: Gy stasis of the rectus abdominis musculature with small fat containing paraumbilical hernia. No free air or fluid. Musculoskeletal: No acute osseus abnormality. No worrisome lytic or blastic osseous lesions. IMPRESSION: 1. Subtle striation within the right renal nephrogram with hazy inflammatory stranding about the right renal collecting system. Findings concerning for acute pyelonephritis with right-sided upper urinary tract infection. Relative sparing of the left kidney and left renal collecting system at this time. 2. No other acute intra-abdominal or pelvic  process. Electronically Signed   By: Jeannine Boga M.D.   On: 06/29/2016 02:15   Dg Chest Portable 1 View  Result Date: 06/29/2016 CLINICAL DATA:  Sepsis. EXAM: PORTABLE CHEST 1 VIEW COMPARISON:  Radiograph 12/14/2010 FINDINGS: There low lung volumes limit assessment. Streaky bibasilar atelectasis. No confluent airspace disease to suggest pneumonia. Heart is at the upper limits normal in size, likely accentuated by technique and low lung volumes. No pleural fluid. No  pulmonary edema. No pneumothorax. No evidence of acute osseous abnormality. IMPRESSION: Hypoventilatory chest with streaky bibasilar atelectasis. Electronically Signed   By: Jeb Levering M.D.   On: 06/29/2016 00:39    DISCHARGE EXAMINATION: Vitals:   07/02/16 1300 07/02/16 2053 07/03/16 0546 07/03/16 1105  BP: (!) 103/55 (!) 91/55 (!) 95/52 98/64  Pulse: 92 81 64 96  Resp: 17 17 17 18   Temp: 99.6 F (37.6 C) 98.7 F (37.1 C) 98.9 F (37.2 C) 98.1 F (36.7 C)  TempSrc: Oral Oral Oral Oral  SpO2: 100% 99% 100% 100%  Weight:      Height:       General appearance: alert, cooperative, appears stated age and no distress Resp: clear to auscultation bilaterally Cardio: regular rate and rhythm, S1, S2 normal, no murmur, click, rub or gallop GI: Mildly tender in the right abdomen and right CVA. No rebound, rigidity or guarding. No masses or organomegaly. Extremities: extremities normal, atraumatic, no cyanosis or edema  DISPOSITION: Home  Discharge Instructions    AMB Referral to [facility] Care Management    Complete by:  As directed    Please assign UMR member for post discharge call. Currently at [facility]. Denies any Link to Wellness needs. Please see liaison notes.Thanks. Marthenia Rolling, [place], RN,BSN-THN [facility] [phone]   Reason for consult:  Please assign UMR member for post discharge call   Expected date of contact:  1-3 days (reserved for hospital discharges)   Call MD for:  difficulty breathing, headache or visual disturbances    Complete by:  As directed    Call MD for:  extreme fatigue    Complete by:  As directed    Call MD for:  persistant dizziness or light-headedness    Complete by:  As directed    Call MD for:  persistant nausea and vomiting    Complete by:  As directed    Call MD for:  severe uncontrolled pain    Complete by:  As directed    Call MD for:  temperature >100.4    Complete by:  As directed    Discharge instructions    Complete  by:  As directed    Please be sure to follow up with [facility] family practice, either end of this week or early next week for follow-up. Take your medications as prescribed. Diet as before.  You were cared for by a hospitalist during your hospital stay. If you have any questions about your discharge medications or the care you received while you were in the hospital after you are discharged, you can call the unit and asked to speak with the hospitalist on call if the hospitalist that took care of you is not available. Once you are discharged, your primary care physician will handle any further medical issues. Please note that NO REFILLS for any discharge medications will be authorized once you are discharged, as it is imperative that you return to your primary care physician (or establish a relationship with a primary care physician if you do  not have one) for your aftercare needs so that they can reassess your need for medications and monitor your lab values. If you do not have a primary care physician, you can call [phone] for a physician referral.   Increase activity slowly    Complete by:  As directed       ALLERGIES:  Allergies  Allergen Reactions  . Strawberry Extract Swelling  . Kiwi Extract   . Red Dye Rash     Discharge Medication List as of 07/03/2016 11:28 AM    START taking these medications   Details  HYDROcodone-acetaminophen (NORCO/VICODIN) 5-325 MG tablet Take 1-2 tablets by mouth every 4 (four) hours as needed for moderate pain., Starting Mon 07/03/2016, Print    levofloxacin (LEVAQUIN) 750 MG tablet Take 1 tablet (750 mg total) by mouth daily., Starting Mon 07/03/2016, Until Sat 07/08/2016, Print    ondansetron (ZOFRAN-ODT) 4 MG disintegrating tablet Take 1 tablet (4 mg total) by mouth every 8 (eight) hours as needed for nausea or vomiting., Starting Mon 07/03/2016, Print    polyethylene glycol (MIRALAX / GLYCOLAX) packet Take 17 g by mouth daily as needed., Starting Mon  07/03/2016, Print    saccharomyces boulardii (FLORASTOR) 250 MG capsule Take 1 capsule (250 mg total) by mouth 2 (two) times daily., Starting Mon 07/03/2016, Print      CONTINUE these medications which have NOT CHANGED   Details  Prenatal Vit-Fe Fumarate-FA (PRENATAL MULTIVITAMIN) TABS tablet Take 1 tablet by mouth daily at 12 noon., Historical Med      STOP taking these medications     ibuprofen (ADVIL,MOTRIN) 600 MG tablet          Follow-up Information    HODGES,BETH, MD. Schedule an appointment as soon as possible for a visit in 1 week(s).   Specialty:  Family Medicine Contact information: [address] [place] [address] [phone]           TOTAL DISCHARGE TIME: 13 minutes  [facility] Pager [phone]  07/03/2016, 1:58 PM

## 2016-07-03 NOTE — Discharge Instructions (Signed)
Pyelonephritis, Adult  Pyelonephritis is a kidney infection. The kidneys are organs that help clean your blood by moving waste out of your blood and into your pee (urine). This infection can happen quickly, or it can last for a long time. In most cases, it clears up with treatment and does not cause other problems.  Follow these instructions at home:  Medicines   · Take over-the-counter and prescription medicines only as told by your doctor.  · Take your antibiotic medicine as told by your doctor. Do not stop taking the medicine even if you start to feel better.  General instructions   · Drink enough fluid to keep your pee clear or pale yellow.  · Avoid caffeine, tea, and carbonated drinks.  · Pee (urinate) often. Avoid holding in pee for long periods of time.  · Pee before and after sex.  · After pooping (having a bowel movement), women should wipe from front to back. Use each tissue only once.  · Keep all follow-up visits as told by your doctor. This is important.  Contact a doctor if:  · You do not feel better after 2 days.  · Your symptoms get worse.  · You have a fever.  Get help right away if:  · You cannot take your medicine or drink fluids as told.  · You have chills and shaking.  · You throw up (vomit).  · You have very bad pain in your side (flank) or back.  · You feel very weak or you pass out (faint).  This information is not intended to replace advice given to you by your health care provider. Make sure you discuss any questions you have with your health care provider.  Document Released: 04/06/2004 Document Revised: 08/05/2015 Document Reviewed: 06/22/2014  Elsevier Interactive Patient Education © 2017 [employer] Inc[employer]

## 2016-07-03 NOTE — Progress Notes (Signed)
All discharge instructions reviewed in detail with pt and mother; understanding verbalized by both.  No questions or concerns at time of discharge.  IV removed without difficulty; catheter tip intact.  VSS.  Pt discharged to home in stable condition with mother present.

## 2016-07-04 ENCOUNTER — Other Ambulatory Visit: Payer: Self-pay | Admitting: *Deleted

## 2016-07-04 ENCOUNTER — Encounter: Payer: Self-pay | Admitting: *Deleted

## 2016-07-04 LAB — CULTURE, BLOOD (ROUTINE X 2)
CULTURE: NO GROWTH
Culture: NO GROWTH
Special Requests: ADEQUATE
Special Requests: ADEQUATE

## 2016-07-04 NOTE — Patient Outreach (Signed)
[facility]) Care Management  07/04/2016  Kaitlyn Moreno 1995/09/23 [id number]   Subjective: Telephone call to patient's mobile number, spoke with patient, and HIPAA verified.  Discussed [facility] Care Management [facility]MR Transition of care follow up, patient voiced understanding, and is in agreement to follow up.   Patient states she is doing better, still very tired, and increasing activity slowly.  States she is in the process of changing primary MD  from Dr. Abner Greenspan due to  difficulty with obtaining appointments, to Dr. Leonor Liv.   States she has already contacted Dr. Elenor Moreno office and has been accepted as a patient.  States she will call Dr. Elenor Moreno office today to schedule hospital follow up appointment and contact this RNCM if assistance needed.   Patient states she is aware of [facility] pharmacy benefit and her mother who is [employer] employee, has pharmacy contact information if she decides to utilize this benefit.  Patient states she does not have any transition of care, care coordination, disease management, disease monitoring, transportation, community resource, or pharmacy needs at this time.  States she is very appreciative of the follow up and is in agreement to receive [facility] Care Management information.    Objective: Per chart review, patient hospitalized 06/29/16 - 07/03/16 for Pyelonephritis.   Patient has a history of sepsis secondary to Pyelonephritis, Lactating Mother (has a 2 month ago), [name], and Stickler's syndrome .   Patient has infant son at [facility] in ICU.    Assessment:  Received [facility]MR Transition of care referral on 06/30/16.   Transition of care follow up completed, no care management needs, and will proceed with case closure.    Plan: RNCM will send patient successful outreach letter, [facility] pamphlet, and magnet. RNCM will send case closure due to follow up completed / no care management needs request to Iverson Alamin at [facility] Care  Management.   Kade Demicco H. Gardiner Barefoot, BSN, CCM [facility] Care Management [facility] Telephonic CM Phone: [phone] Fax: [phone]

## 2016-07-11 DIAGNOSIS — Z1389 Encounter for screening for other disorder: Secondary | ICD-10-CM | POA: Diagnosis not present

## 2016-07-11 DIAGNOSIS — R300 Dysuria: Secondary | ICD-10-CM | POA: Diagnosis not present

## 2016-07-11 DIAGNOSIS — Z6834 Body mass index (BMI) 34.0-34.9, adult: Secondary | ICD-10-CM | POA: Diagnosis not present

## 2016-07-11 DIAGNOSIS — Q898 Other specified congenital malformations: Secondary | ICD-10-CM | POA: Diagnosis not present

## 2016-07-20 DIAGNOSIS — M546 Pain in thoracic spine: Secondary | ICD-10-CM | POA: Diagnosis not present

## 2016-07-20 DIAGNOSIS — M545 Low back pain: Secondary | ICD-10-CM | POA: Diagnosis not present

## 2016-08-19 DIAGNOSIS — F329 Major depressive disorder, single episode, unspecified: Secondary | ICD-10-CM | POA: Diagnosis not present

## 2016-08-19 DIAGNOSIS — T50901A Poisoning by unspecified drugs, medicaments and biological substances, accidental (unintentional), initial encounter: Secondary | ICD-10-CM | POA: Diagnosis not present

## 2016-08-19 DIAGNOSIS — N390 Urinary tract infection, site not specified: Secondary | ICD-10-CM | POA: Diagnosis not present

## 2016-08-19 DIAGNOSIS — Q898 Other specified congenital malformations: Secondary | ICD-10-CM | POA: Diagnosis not present

## 2016-08-19 DIAGNOSIS — R51 Headache: Secondary | ICD-10-CM | POA: Diagnosis not present

## 2016-08-19 DIAGNOSIS — T43222A Poisoning by selective serotonin reuptake inhibitors, intentional self-harm, initial encounter: Secondary | ICD-10-CM | POA: Diagnosis not present

## 2016-08-19 DIAGNOSIS — R402441 Other coma, without documented Glasgow coma scale score, or with partial score reported, in the field [EMT or ambulance]: Secondary | ICD-10-CM | POA: Diagnosis not present

## 2016-08-19 DIAGNOSIS — E860 Dehydration: Secondary | ICD-10-CM | POA: Diagnosis not present

## 2016-08-19 DIAGNOSIS — T43202A Poisoning by unspecified antidepressants, intentional self-harm, initial encounter: Secondary | ICD-10-CM | POA: Diagnosis not present

## 2016-08-19 DIAGNOSIS — T1491XA Suicide attempt, initial encounter: Secondary | ICD-10-CM | POA: Diagnosis not present

## 2016-08-20 ENCOUNTER — Inpatient Hospital Stay (HOSPITAL_COMMUNITY)
Admission: AD | Admit: 2016-08-20 | Discharge: 2016-08-24 | DRG: 885 | Disposition: A | Discharge status: Home / Self Care | Payer: 59 | Attending: Psychiatry | Admitting: Psychiatry

## 2016-08-20 ENCOUNTER — Encounter (HOSPITAL_COMMUNITY): Payer: Self-pay

## 2016-08-20 DIAGNOSIS — N390 Urinary tract infection, site not specified: Secondary | ICD-10-CM | POA: Diagnosis not present

## 2016-08-20 DIAGNOSIS — T43222A Poisoning by selective serotonin reuptake inhibitors, intentional self-harm, initial encounter: Secondary | ICD-10-CM | POA: Diagnosis not present

## 2016-08-20 DIAGNOSIS — F419 Anxiety disorder, unspecified: Secondary | ICD-10-CM | POA: Diagnosis present

## 2016-08-20 DIAGNOSIS — Y92099 Unspecified place in other non-institutional residence as the place of occurrence of the external cause: Secondary | ICD-10-CM | POA: Diagnosis not present

## 2016-08-20 DIAGNOSIS — F339 Major depressive disorder, recurrent, unspecified: Secondary | ICD-10-CM | POA: Diagnosis not present

## 2016-08-20 DIAGNOSIS — F329 Major depressive disorder, single episode, unspecified: Secondary | ICD-10-CM | POA: Diagnosis present

## 2016-08-20 DIAGNOSIS — R51 Headache: Secondary | ICD-10-CM | POA: Diagnosis not present

## 2016-08-20 DIAGNOSIS — T1491XA Suicide attempt, initial encounter: Secondary | ICD-10-CM | POA: Diagnosis not present

## 2016-08-20 DIAGNOSIS — Q898 Other specified congenital malformations: Secondary | ICD-10-CM | POA: Diagnosis not present

## 2016-08-20 DIAGNOSIS — F322 Major depressive disorder, single episode, severe without psychotic features: Secondary | ICD-10-CM | POA: Diagnosis present

## 2016-08-20 DIAGNOSIS — E860 Dehydration: Secondary | ICD-10-CM | POA: Diagnosis not present

## 2016-08-20 DIAGNOSIS — T50901A Poisoning by unspecified drugs, medicaments and biological substances, accidental (unintentional), initial encounter: Secondary | ICD-10-CM | POA: Diagnosis not present

## 2016-08-20 NOTE — BH Assessment (Signed)
Tele Assessment Note   PER EUGENE NAUGHTON, LPC:  Kaitlyn Moreno is an 21 y.o. female who presented to [facility] by EMS on 08/19/16 after intentionally ingesting 4-5 Lexapro tabs (from an old prescription).  Another report stated that Pt overdosed on 45 Lexapro tabs, but Pt denied ingesting that amount.  Pt was admitted to ICU and was medically cleared today.  Pt provided history.  Pt reported that she was suicidal yesterday, and that she intentionally ingested 4-5 Lexapro tabs in an apparent suicide attempt.  Pt endorsed the following symptoms:   Suicidal ideation with plan to overdose; persistent and unremitting despondency for a while; poor sleep; poor appetite.  PT also endorsed numerous psychosocial stressors, significantly her husband is going to jail soon.  Pt will then have to care for her two sons alone.  Pt denied any current suicidal ideation.  She admitted attempting suicide on 08/19/16 by overdose.  She denied homicidal ideation, substance use concerns, hallucination, and self-injury.    During assessment, Pt presented as oriented.  She had good eye contact and was cooperative.  Pt was drowsy during assessment.  Pt was in scrubs and appeared appropriately groomed.  Pt's mood reported as depressed, and affect was mood-congruent.  Pt endorsed recent suicidal ideation, intentional overdose on 08/20/16, and other depressive symptoms (see above).  Pt's speech was normal in rate, rhythm, and volume.  Pt's thought processes were slow.  Thought content was logical and goal-oriented.  There was no evidence of delusion.  Memory and concentration were fair.  Impulse control, judgment, and insight were considered poor.  Diagnosis: Major Depressive Disorder, Recurrent, Severe Without Psychotic Features.  Past Medical History:  Past Medical History:  Diagnosis Date  . Anxiety   . Arthritis    "right hip; hands" (06/29/2016)  . Headache    "probably weekly" (06/29/2016)  . Migraine     "probably 2 times/month" (06/29/2016)  . Murmur ~ 2007  . [name]   . Pneumonia    "multiple times" (06/29/2016)  . PONV (postoperative nausea and vomiting)   . Pyelonephritis 06/29/2016  . 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 CLEFT PALATE REPAIR  1999; ~ 2005  . INTRAUTERINE DEVICE INSERTION  06/2016  . TYMPANOSTOMY TUBE PLACEMENT Bilateral X 7    Family History:  Family History  Problem Relation Age of Onset  . Diabetes Mother   . Stickler syndrome Father     Social History:  reports that she has never smoked. She has never used smokeless tobacco. She reports that she does not drink alcohol or use drugs.  Additional Social History:  Alcohol / Drug Use Pain Medications: See MAR Prescriptions: See MAR Over the Counter: See MAR History of alcohol / drug use?: No history of alcohol / drug abuse Longest period of sobriety (when/how long): NA  CIWA:   COWS:    PATIENT STRENGTHS: (choose at least two) Ability for insight Average or above average intelligence Capable of independent living Communication skills Financial means General fund of knowledge Physical Health  Allergies:  Allergies  Allergen Reactions  . Strawberry Extract Swelling  . Kiwi Extract   . Red Dye Rash    Home Medications:  (Not in a hospital admission)  OB/GYN Status:  No LMP recorded.  General Assessment Data Location of Assessment: [facility] Assessment Services [facility]) TTS Assessment: Out of system Is this  a Tele or Face-to-Face Assessment?: Tele Assessment Is this an Initial Assessment or a Re-assessment for this encounter?: Initial Assessment Marital status: Single Maiden name: NA Is patient pregnant?: No Pregnancy Status: No Living Arrangements: Spouse/significant other, Children Can pt return to current living arrangement?: Yes Admission Status: Voluntary Is  patient capable of signing voluntary admission?: Yes Referral Source: Other [facility]) Insurance type: [facility]     Crisis Care Plan Living Arrangements: Spouse/significant other, Children Legal Guardian: Other: (self) Name of Psychiatrist: Unknown Name of Therapist: Unknown  Education Status Is patient currently in school?: No Current Grade: NA Highest grade of school patient has completed: NA Name of school: NA Contact person: NA  Risk to self with the past 6 months Suicidal Ideation: Yes-Currently Present Has patient been a risk to self within the past 6 months prior to admission? : Yes Suicidal Intent: Yes-Currently Present Has patient had any suicidal intent within the past 6 months prior to admission? : Yes Is patient at risk for suicide?: Yes Suicidal Plan?: Yes-Currently Present Has patient had any suicidal plan within the past 6 months prior to admission? : Yes Specify Current Suicidal Plan: Pt overdosed on Lexapro in suicide attempt Access to Means: Yes Specify Access to Suicidal Means: Access to Lexapro What has been your use of drugs/alcohol within the last 12 months?: None Previous Attempts/Gestures: Yes How many times?: 2 Other Self Harm Risks: None Triggers for Past Attempts: Family contact Intentional Self Injurious Behavior: None Family Suicide History: Unknown Recent stressful life event(s): Other (Comment) (Boyfriend charges with statutory rape) Persecutory voices/beliefs?: No Depression: Yes Depression Symptoms: Despondent, Tearfulness, Fatigue, Feeling worthless/self pity, Feeling angry/irritable Substance abuse history and/or treatment for substance abuse?: No Suicide prevention information given to non-admitted patients: Not applicable  Risk to Others within the past 6 months Homicidal Ideation: No Does patient have any lifetime risk of violence toward others beyond the six months prior to admission? : No Thoughts of Harm to Others:  No Current Homicidal Intent: No Current Homicidal Plan: No Access to Homicidal Means: No Identified Victim: None History of harm to others?: No Assessment of Violence: None Noted Violent Behavior Description: None Does patient have access to weapons?: No Criminal Charges Pending?: No Does patient have a court date: No Is patient on probation?: No  Psychosis Hallucinations: None noted Delusions: None noted  Mental Status Report Appearance/Hygiene: In hospital gown Eye Contact: Good Motor Activity: Unremarkable Speech: Logical/coherent Level of Consciousness: Drowsy Mood: Euthymic Affect: Appropriate to circumstance Anxiety Level: Moderate Thought Processes: Coherent, Relevant Judgement: Impaired Orientation: Person, Place, Time, Situation, Appropriate for developmental age Obsessive Compulsive Thoughts/Behaviors: None  Cognitive Functioning Concentration: Fair Memory: Recent Intact, Remote Intact IQ: Average Insight: Poor Impulse Control: Poor Appetite: Fair Weight Loss: 0 Weight Gain: 0 Sleep: No Change Total Hours of Sleep: 8 Vegetative Symptoms: None     Prior Inpatient Therapy Prior Inpatient Therapy: No Prior Therapy Dates: NA Prior Therapy Facilty/Provider(s): NA Reason for Treatment: NA  Prior Outpatient Therapy Prior Outpatient Therapy: Yes Prior Therapy Dates: NA Prior Therapy Facilty/Provider(s): NA Reason for Treatment: NA Does patient have an ACCT team?: No Does patient have Intensive In-House Services?  : No Does patient have [facility] services? : No Does patient have P4CC services?: No  ADL Screening (condition at time of admission) Is the patient deaf or have difficulty hearing?: No Does the patient have difficulty seeing, even when wearing glasses/contacts?: No Does the patient have difficulty concentrating, remembering, or making decisions?: No Does the patient have  difficulty dressing or bathing?: No Does the patient have difficulty  walking or climbing stairs?: No Weakness of Legs: None Weakness of Arms/Hands: None       Abuse/Neglect Assessment (Assessment to be complete while patient is alone) Physical Abuse: Denies Verbal Abuse: Denies Sexual Abuse: Denies Exploitation of patient/patient's resources: Denies Self-Neglect: Denies     Merchant navy officerAdvance Directives (For Healthcare) Does Patient Have a Medical Advance Directive?: No Would patient like information on creating a medical advance directive?: No - Patient declined    Additional Information 1:1 In Past 12 Months?: No CIRT Risk: No Elopement Risk: No Does patient have medical clearance?: Yes     Disposition: [facility] by Leighton Ruffina Okonkwo, NP.  Disposition Initial Assessment Completed for this Encounter: Yes Disposition of Patient: Inpatient treatment program Type of inpatient treatment program: Adult  Pamalee LeydenWarrick Jr, Annora Guderian Ellis 08/20/2016 7:31 PM

## 2016-08-21 DIAGNOSIS — F339 Major depressive disorder, recurrent, unspecified: Secondary | ICD-10-CM

## 2016-08-21 DIAGNOSIS — F329 Major depressive disorder, single episode, unspecified: Secondary | ICD-10-CM | POA: Diagnosis not present

## 2016-08-21 DIAGNOSIS — T50901A Poisoning by unspecified drugs, medicaments and biological substances, accidental (unintentional), initial encounter: Secondary | ICD-10-CM | POA: Diagnosis not present

## 2016-08-21 DIAGNOSIS — T1491XA Suicide attempt, initial encounter: Secondary | ICD-10-CM | POA: Diagnosis not present

## 2016-08-21 DIAGNOSIS — E860 Dehydration: Secondary | ICD-10-CM | POA: Diagnosis not present

## 2016-08-21 DIAGNOSIS — T43222A Poisoning by selective serotonin reuptake inhibitors, intentional self-harm, initial encounter: Secondary | ICD-10-CM

## 2016-08-21 LAB — CBC
HEMATOCRIT: 35.8 % — AB (ref 36.0–46.0)
Hemoglobin: 11.6 g/dL — ABNORMAL LOW (ref 12.0–15.0)
MCH: 29.4 pg (ref 26.0–34.0)
MCHC: 32.4 g/dL (ref 30.0–36.0)
MCV: 90.6 fL (ref 78.0–100.0)
PLATELETS: 399 10*3/uL (ref 150–400)
RBC: 3.95 MIL/uL (ref 3.87–5.11)
RDW: 15.2 % (ref 11.5–15.5)
WBC: 9.8 10*3/uL (ref 4.0–10.5)

## 2016-08-21 LAB — URINALYSIS, ROUTINE W REFLEX MICROSCOPIC
Bilirubin Urine: NEGATIVE
Glucose, UA: NEGATIVE mg/dL
Hgb urine dipstick: NEGATIVE
Ketones, ur: NEGATIVE mg/dL
Nitrite: NEGATIVE
PROTEIN: 30 mg/dL — AB
SPECIFIC GRAVITY, URINE: 1.021 (ref 1.005–1.030)
pH: 6 (ref 5.0–8.0)

## 2016-08-21 LAB — PREGNANCY, URINE: Preg Test, Ur: NEGATIVE

## 2016-08-21 LAB — COMPREHENSIVE METABOLIC PANEL
ALT: 39 U/L (ref 14–54)
AST: 22 U/L (ref 15–41)
Albumin: 3.8 g/dL (ref 3.5–5.0)
Alkaline Phosphatase: 63 U/L (ref 38–126)
Anion gap: 8 (ref 5–15)
BUN: 10 mg/dL (ref 6–20)
CO2: 26 mmol/L (ref 22–32)
CREATININE: 0.67 mg/dL (ref 0.44–1.00)
Calcium: 8.6 mg/dL — ABNORMAL LOW (ref 8.9–10.3)
Chloride: 105 mmol/L (ref 101–111)
GFR calc Af Amer: >60 mL/min (ref >60)
GFR calc non Af Amer: >60 mL/min (ref >60)
Glucose, Bld: 88 mg/dL (ref 65–99)
POTASSIUM: 3.7 mmol/L (ref 3.5–5.1)
SODIUM: 139 mmol/L (ref 135–145)
Total Bilirubin: 0.3 mg/dL (ref 0.3–1.2)
Total Protein: 7.1 g/dL (ref 6.5–8.1)

## 2016-08-21 MED ORDER — ESCITALOPRAM OXALATE 5 MG PO TABS: 5.0000 mg | ORAL_TABLET | Freq: Every day | ORAL | Status: DC

## 2016-08-21 MED ORDER — MAGNESIUM HYDROXIDE 400 MG/5ML PO SUSP
30.0000 mL | Freq: Every day | ORAL | Status: DC | PRN
Start: 2016-08-21 — End: 2016-08-24

## 2016-08-21 MED ORDER — MIRTAZAPINE 15 MG PO TBDP
15.0000 mg | ORAL_TABLET | Freq: Every day | ORAL | Status: DC
  Administered 2016-08-21: 15 mg via ORAL
  Filled 2016-08-21 (×3): qty 1

## 2016-08-21 MED ORDER — ACETAMINOPHEN 325 MG PO TABS: 650.0000 mg | ORAL_TABLET | Freq: Four times a day (QID) | ORAL | Status: DC | PRN

## 2016-08-21 MED ORDER — MIRTAZAPINE 7.5 MG PO TABS: 7.5000 mg | ORAL_TABLET | Freq: Every day | ORAL | Status: DC

## 2016-08-21 MED ORDER — TRAZODONE HCL 50 MG PO TABS: 50.0000 mg | ORAL_TABLET | Freq: Every evening | ORAL | Status: DC | PRN

## 2016-08-21 NOTE — H&P (Addendum)
Psychiatric Admission Assessment Adult  Patient Identification: Kaitlyn Moreno MRN:  [id number] Date of Evaluation:  08/21/2016 Chief Complaint:  " a lot was going on at once " Principal Diagnosis: Suicide Attempt by Overdose  Diagnosis:   Patient Active Problem List   Diagnosis Date Noted  . MDD (major depressive disorder) [F32.9] 08/21/2016  . MDD (major depressive disorder), single episode, severe ([place]) [F32.2] 08/20/2016  . Acute pyelonephritis [N10] 06/30/2016  . Pyelonephritis [N12] 06/29/2016  . Streptococcal sepsis, unspecified ([place]) [A40.9] 06/29/2016  . Postpartum state [Z39.2] 06/29/2016  . Abdominal pain [R10.9] 06/29/2016  . Sepsis ([place]) [A41.9]    History of Present Illness: 21 year old female, has two children. States she has a 4 month child. She states she has had significant stressors recently. She reports that her infant child has congenital illness ( Stickler Syndrome) , and has a permanent tracheostomy. States that the child had recently somehow pulled trach. Tube out, and she had to revive him and called EMS. At this time child is stable. She also recently found out that her husband has been unfaithful, and is facing incarceration due to sexual related criminal charges . States she impulsively overdosed on Lexapro - states she took 4 or 5 tablets , which she had from an old prescription she was no longer taking . She states she regretted this " almost as soon as I did it", and told her husband, resulting in going to ED . Overdose occurred 2 days .   Associated Signs/Symptoms: Depression Symptoms:  depressed mood, insomnia, suicidal attempt, anxiety, loss of energy/fatigue, (Hypo) Manic Symptoms:  Denies  Anxiety Symptoms: reports some panic attacks and agoraphobia Psychotic Symptoms:  Denies  PTSD Symptoms: Reports nightmares and frequent recurring intrusive memories from a history of falling down stairs and both her children having had neonatal difficulties   Total Time spent with patient: 45 minutes  Past Psychiatric History: no prior psychiatric illnesses, has never attempted suicide in the past, remote history of self cutting as a young teenager, denies history of mania or hypomania, denies history of psychosis, describes occasional , sometimes frequent panic attacks, and describes agoraphobia.   Is the patient at risk to self? Yes.    Has the patient been a risk to self in the past 6 months? No.  Has the patient been a risk to self within the distant past? No.  Is the patient a risk to others? No.  Has the patient been a risk to others in the past 6 months? No.  Has the patient been a risk to others within the distant past? No.   Prior Inpatient Therapy: Prior Inpatient Therapy: No Prior Therapy Dates: NA Prior Therapy Facilty/Provider(s): NA Reason for Treatment: NA Prior Outpatient Therapy: Prior Outpatient Therapy: Yes Prior Therapy Dates: NA Prior Therapy Facilty/Provider(s): NA Reason for Treatment: NA Does patient have an ACCT team?: No Does patient have Intensive In-House Services?  : No Does patient have [facility] services? : No Does patient have P4CC services?: No  Alcohol Screening: 1. How often do you have a drink containing alcohol?: Never 9. Have you or someone else been injured as a result of your drinking?: No 10. Has a relative or friend or a doctor or another health worker been concerned about your drinking or suggested you cut down?: No Alcohol Use Disorder Identification Test Final Score (AUDIT): 0 Brief Intervention: AUDIT score less than 7 or less-screening does not suggest unhealthy drinking-brief intervention not indicated Substance Abuse History in the  last 12 months:  Denies any alcohol or drug abuse  Consequences of Substance Abuse: Denies  Previous Psychotropic Medications: was not taking any medications prior to admission- had been on Lexapro for a year, but states it caused insomnia Psychological  Evaluations:  No  Past Medical History:  Past Medical History:  Diagnosis Date  . Anxiety   . Arthritis    "right hip; hands" (06/29/2016)  . Headache    "probably weekly" (06/29/2016)  . Migraine    "probably 2 times/month" (06/29/2016)  . Murmur ~ 2007  . Polo Riley sequence   . Pneumonia    "multiple times" (06/29/2016)  . PONV (postoperative nausea and vomiting)   . Pyelonephritis 06/29/2016  . 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 [place]; ~ 2005  . INTRAUTERINE DEVICE INSERTION  06/2016  . TYMPANOSTOMY TUBE PLACEMENT Bilateral X 7   Family History: Father and mother alive, divorced, has distant relationship with father, has one sister.  Family History  Problem Relation Age of Onset  . Diabetes Mother   . Stickler syndrome Father    Family Psychiatric  History: states sister has history of bipolar disorder, no history of suicides , no history of family substance abuse  Tobacco Screening: Have you used any form of tobacco in the last 30 days? (Cigarettes, Smokeless Tobacco, Cigars, and/or Pipes): No Social History: 21 year old , married when she was 44, currently not working, had disability in the past but not at present, denies legal issues, has two children, ages 77 years old and 42 months old. The children are currently with mother.  History  Alcohol Use No     History  Drug Use No    Additional Social History: Marital status: Single    Pain Medications: See MAR Prescriptions: See MAR Over the Counter: See MAR History of alcohol / drug use?: No history of alcohol / drug abuse Longest period of sobriety (when/how long): NA   Allergies:   Allergies  Allergen Reactions  . Strawberry Extract Swelling  . Kiwi Extract   . Red Dye Rash   Lab Results:  Results for orders placed or performed during the hospital encounter of 08/20/16 (from the past  48 hour(s))  Comprehensive metabolic panel     Status: Abnormal   Collection Time: 08/21/16  6:24 AM  Result Value Ref Range   Sodium 139 135 - 145 mmol/L   Potassium 3.7 3.5 - 5.1 mmol/L   Chloride 105 101 - 111 mmol/L   CO2 26 22 - 32 mmol/L   Glucose, Bld 88 65 - 99 mg/dL   BUN 10 6 - 20 mg/dL   Creatinine, Ser 0.67 0.44 - 1.00 mg/dL   Calcium 8.6 (L) 8.9 - 10.3 mg/dL   Total Protein 7.1 6.5 - 8.1 g/dL   Albumin 3.8 3.5 - 5.0 g/dL   AST 22 15 - 41 U/L   ALT 39 14 - 54 U/L   Alkaline Phosphatase 63 38 - 126 U/L   Total Bilirubin 0.3 0.3 - 1.2 mg/dL   GFR calc non Af Amer >60 >60 mL/min   GFR calc Af Amer >60 >60 mL/min    Comment: (NOTE) The eGFR has been calculated using the CKD EPI equation. This calculation has not been validated in all clinical situations. eGFR's persistently <60 mL/min signify possible Chronic Kidney Disease.  Anion gap 8 5 - 15    Comment: Performed at [facility], [place] [address] [place], [place] [address]  CBC     Status: Abnormal   Collection Time: 08/21/16  6:24 AM  Result Value Ref Range   WBC 9.8 4.0 - 10.5 K/uL   RBC 3.95 3.87 - 5.11 MIL/uL   Hemoglobin 11.6 (L) 12.0 - 15.0 g/dL   HCT 35.8 (L) 36.0 - 46.0 %   MCV 90.6 78.0 - 100.0 fL   MCH 29.4 26.0 - 34.0 pg   MCHC 32.4 30.0 - 36.0 g/dL   RDW 15.2 11.5 - 15.5 %   Platelets 399 150 - 400 K/uL    Comment: Performed at [facility], [place] [address] [place], [place] [address]  Pregnancy, urine     Status: None   Collection Time: 08/21/16  6:28 AM  Result Value Ref Range   Preg Test, Ur NEGATIVE NEGATIVE    Comment:        THE SENSITIVITY OF THIS METHODOLOGY IS >20 mIU/mL. Performed at [facility], [place] [address] [place], [place] [address]   Urinalysis, Routine w reflex microscopic     Status: Abnormal   Collection Time: 08/21/16  6:28 AM  Result Value Ref Range   Color, Urine YELLOW YELLOW   APPearance CLOUDY (A) CLEAR    Specific Gravity, Urine 1.021 1.005 - 1.030   pH 6.0 5.0 - 8.0   Glucose, UA NEGATIVE NEGATIVE mg/dL   Hgb urine dipstick NEGATIVE NEGATIVE   Bilirubin Urine NEGATIVE NEGATIVE   Ketones, ur NEGATIVE NEGATIVE mg/dL   Protein, ur 30 (A) NEGATIVE mg/dL   Nitrite NEGATIVE NEGATIVE   Leukocytes, UA SMALL (A) NEGATIVE   RBC / HPF 6-30 0 - 5 RBC/hpf   WBC, UA 6-30 0 - 5 WBC/hpf   Bacteria, UA RARE (A) NONE SEEN   Squamous Epithelial / LPF TOO NUMEROUS TO COUNT (A) NONE SEEN   Mucous PRESENT     Comment: Performed at [facility], [place] [address] [place], [place] [address]    Blood Alcohol level:  No results found for: [facility]  Metabolic Disorder Labs:  No results found for: HGBA1C, MPG No results found for: PROLACTIN No results found for: CHOL, TRIG, HDL, CHOLHDL, VLDL, LDLCALC  Current Medications: Current Facility-Administered Medications  Medication Dose Route Frequency Provider Last Rate Last Dose  . acetaminophen (TYLENOL) tablet 650 mg  650 mg Oral Q6H PRN Aundra Dubin, MD      . magnesium hydroxide (MILK OF MAGNESIA) suspension 30 mL  30 mL Oral Daily PRN Aundra Dubin, MD      . traZODone (DESYREL) tablet 50 mg  50 mg Oral QHS PRN Aundra Dubin, MD       PTA Medications: Prescriptions Prior to Admission  Medication Sig Dispense Refill Last Dose  . Prenatal Vit-Fe Fumarate-FA (PRENATAL MULTIVITAMIN) TABS tablet Take 1 tablet by mouth daily at 12 noon.   Taking    Musculoskeletal: Strength & Muscle Tone: within normal limits Gait & Station: normal Patient leans: N/A  Psychiatric Specialty Exam: Physical Exam  Review of Systems  Constitutional: Negative.   HENT: Positive for tinnitus.        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   Respiratory: Negative.   Cardiovascular: Negative.   Gastrointestinal: Negative for blood in stool, nausea and vomiting.  Genitourinary: Negative.    Musculoskeletal:  Negative.   Skin: Negative.   Neurological: Negative for seizures.  Endo/Heme/Allergies: Negative.   Psychiatric/Behavioral: Positive for depression and suicidal ideas.  All other systems reviewed and are negative. of note, patient reports recently treated UTI- dysuria has improved , resolved at this time, denies fever or chills  Blood pressure 110/73, pulse 87, temperature 98.9 F (37.2 C), temperature source Oral, resp. rate 20, height 5' 2"  (1.575 m), weight 92.5 kg (204 lb).Body mass index is 37.31 kg/m.  General Appearance: Well Groomed  Eye Contact:  Good  Speech:  Normal Rate  Volume:  Normal  Mood:  States her mood is improved today   Affect:  appropriate, reactive   Thought Process:  Linear and Descriptions of Associations: Intact  Orientation:  Full (Time, Place, and Person)  Thought Content:  denies hallucinations, no delusions , not internally preoccupied   Suicidal Thoughts:  No denies any current suicidal or self injurious ideations, denies any homicidal or violent ideations  Homicidal Thoughts:  No  Memory:  recent and remote grossly intact   Judgement:  Other:  improving   Insight:  improving   Psychomotor Activity:  Normal  Concentration:  Concentration: Good and Attention Span: Good  Recall:  Good  Fund of Knowledge:  Good  Language:  Good  Akathisia:  Negative  Handed:  Right  AIMS (if indicated):     Assets:  Communication Skills Desire for Improvement Resilience  ADL's:  Intact  Cognition:  WNL  Sleep:  Number of Hours: 4 (late night admission)    Treatment Plan Summary: Daily contact with patient to assess and evaluate symptoms and progress in treatment, Medication management, Plan inpatient admission and medications as below  Observation Level/Precautions:  15 minute checks  Laboratory:  as needed   Psychotherapy:  Milieu, group therapy  Medications:  We discussed options- patient interested in  antidepressant trial- we discussed  issues regarding breast feeding and antidepressant passing to child via breast milk- she expresses understanding . We reviewed several options- prefers not to retake Lexapro due to insomnia. Zoloft / Prozac are  listed as possibly having red dye in some of its presentations. We decided on Remeron as she reports decreased sleep and appetite in addition to her depression Start REMERON 7.5 mgrs QHS   Consultations:  As needed   Discharge Concerns:  -  Estimated LOS: 5-6 days   Other:     Physician Treatment Plan for Primary Diagnosis:  Suicide Attempt Long Term Goal(s): Improvement in symptoms so as ready for discharge  Short Term Goals: Ability to verbalize feelings will improve, Ability to disclose and discuss suicidal ideas, Ability to demonstrate self-control will improve, Ability to identify and develop effective coping behaviors will improve and Ability to maintain clinical measurements within normal limits will improve  Physician Treatment Plan for Secondary Diagnosis: Active Problems:   MDD (major depressive disorder), single episode, severe (HCC)   MDD (major depressive disorder)  Long Term Goal(s): Improvement in symptoms so as ready for discharge  Short Term Goals: Ability to verbalize feelings will improve, Ability to disclose and discuss suicidal ideas, Ability to demonstrate self-control will improve, Ability to identify and develop effective coping behaviors will improve and Ability to maintain clinical measurements within normal limits will improve  I certify that inpatient services furnished can reasonably be expected to improve the patient's condition.    Jenne Campus, MD 6/11/20182:29 PM

## 2016-08-21 NOTE — Progress Notes (Signed)
Patient ID: Kaitlyn Moreno, female   DOB: [birth year], 21 y.o.   MRN: [id number]  Admission note: Patient is a  voluntary admission in no acute distress for depression and attempted overdose. Pt reports intentional OD on 5 Lexapro tablets and later admitted to ICU. Pt reports stressor from family responsibilities and relationship with husband. Pt reports husband cheated on her with a family friend's 369 year old daughter. Pt reports her youngest son just came home from NICU. Pt have a hearing impairment. Does not use hearing aid because of not fitting well. Pt is a high fall risk. Pt denies SI/HI/AVH. Fall/suicide risk safety plan explained to the patient. 15 minutes checks started for safety.

## 2016-08-21 NOTE — Progress Notes (Signed)
Recreation Therapy Notes  Date: 08/21/16 Time: 0930 Location: 300 Hall Group Room  Group Topic: Stress Management  Goal Area(s) Addresses:  Patient will verbalize importance of using healthy stress management.  Patient will identify positive emotions associated with healthy stress management.   Behavioral Response: Engaged  Intervention: Stress Management  Activity :  Meditation.  LRT introduced the stress management technique of meditation.  LRT played Moreno meditation from the Calm app to allow patients to participate in meditating.  Patients were to follow along with the meditation to fully engage in the technique.  Education:  Stress Management, Discharge Planning.   Education Outcome: Acknowledges edcuation/In group clarification offered/Needs additional education  Clinical Observations/Feedback: Pt did not attend group.   Kaitlyn Moreno, LRT/CTRS         Kaitlyn Moreno 08/21/2016 12:43 PM 

## 2016-08-21 NOTE — Tx Team (Signed)
Initial Treatment Plan 08/21/2016 12:59 AM Kaitlyn ForsterEmily D Moreno ZOX:[id number]    PATIENT STRESSORS: Health problems Marital or family conflict Substance abuse   PATIENT STRENGTHS: Ability for insight Average or above average intelligence General fund of knowledge Motivation for treatment/growth Supportive family/friends   PATIENT IDENTIFIED PROBLEMS:   depression  Risk for suicide  anxiety  Family conflict  Intentional overdose  "learn new coping skills"         DISCHARGE CRITERIA:  Improved stabilization in mood, thinking, and/or behavior Motivation to continue treatment in a less acute level of care Need for constant or close observation no longer present Safe-care adequate arrangements made  PRELIMINARY DISCHARGE PLAN: Attend PHP/IOP Participate in family therapy Return to previous living arrangement  PATIENT/FAMILY INVOLVEMENT: This treatment plan has been presented to and reviewed with the patient, Kaitlyn Forstermily D Moreno,  The patient and family have been given the opportunity to ask questions and make suggestions.  Earnest ConroyJEHU-APPIAH, Saron Vanorman K, RN 08/21/2016, 12:59 AM

## 2016-08-21 NOTE — [facility] Group Notes (Signed)
[facility]   Date/time:  08/21/2016 8:45 - 9:15 AM  Group Description:  Discharge planning group reviews patient's anticipated discharge plans and assists patients to anticipate and address any barriers to wellness/recovery in the community.  Suicide prevention education is reviewed with patients in group.  Participation Quality:  appropriate  Plan for Discharge/Comments:  Return home w family  Transportation Means: family    Supports:  Mother, sisters  Kaitlyn Moreno, [place] Lead Clinical Social Worker Phone:  [phone]

## 2016-08-21 NOTE — Tx Team (Signed)
Interdisciplinary Treatment and Diagnostic Plan Update  08/21/2016 Time of Session: 9:30 AM Kaitlyn Moreno MRN: [id number]  Principal Diagnosis: <principal problem not specified>  Secondary Diagnoses: Active Problems:   MDD (major depressive disorder), single episode, severe (HCC)   MDD (major depressive disorder)   Current Medications:  Current Facility-Administered Medications  Medication Dose Route Frequency Provider Last Rate Last Dose  . acetaminophen (TYLENOL) tablet 650 mg  650 mg Oral Q6H PRN Aundra Dubin, MD      . magnesium hydroxide (MILK OF MAGNESIA) suspension 30 mL  30 mL Oral Daily PRN Aundra Dubin, MD      . traZODone (DESYREL) tablet 50 mg  50 mg Oral QHS PRN Aundra Dubin, MD       PTA Medications: Prescriptions Prior to Admission  Medication Sig Dispense Refill Last Dose  . Prenatal Vit-Fe Fumarate-FA (PRENATAL MULTIVITAMIN) TABS tablet Take 1 tablet by mouth daily at 12 noon.   Taking    Patient Stressors: Health problems Marital or family conflict Substance abuse  Patient Strengths: Ability for insight Average or above average intelligence General fund of knowledge Motivation for treatment/growth Supportive family/friends  Treatment Modalities: Medication Management, Group therapy, Case management,  1 to 1 session with clinician, Psychoeducation, Recreational therapy.   Physician Treatment Plan for Primary Diagnosis: <principal problem not specified> Long Term Goal(s):     Short Term Goals:    Medication Management: Evaluate patient's response, side effects, and tolerance of medication regimen.  Therapeutic Interventions: 1 to 1 sessions, Unit Group sessions and Medication administration.  Evaluation of Outcomes: Not Met  Physician Treatment Plan for Secondary Diagnosis: Active Problems:   MDD (major depressive disorder), single episode, severe (HCC)   MDD (major depressive disorder)  Long Term Goal(s):      Short Term Goals:       Medication Management: Evaluate patient's response, side effects, and tolerance of medication regimen.  Therapeutic Interventions: 1 to 1 sessions, Unit Group sessions and Medication administration.  Evaluation of Outcomes: Met   RN Treatment Plan for Primary Diagnosis: <principal problem not specified> Long Term Goal(s): Knowledge of disease and therapeutic regimen to maintain health will improve  Short Term Goals: Ability to demonstrate self-control, Ability to verbalize feelings will improve and Ability to disclose and discuss suicidal ideas  Medication Management: RN will administer medications as ordered by provider, will assess and evaluate patient's response and provide education to patient for prescribed medication. RN will report any adverse and/or side effects to prescribing provider.  Therapeutic Interventions: 1 on 1 counseling sessions, Psychoeducation, Medication administration, Evaluate responses to treatment, Monitor vital signs and CBGs as ordered, Perform/monitor CIWA, COWS, AIMS and Fall Risk screenings as ordered, Perform wound care treatments as ordered.  Evaluation of Outcomes: Progressing   LCSW Treatment Plan for Primary Diagnosis: <principal problem not specified> Long Term Goal(s): Safe transition to appropriate next level of care at discharge, Engage patient in therapeutic group addressing interpersonal concerns.  Short Term Goals: Engage patient in aftercare planning with referrals and resources, Increase ability to appropriately verbalize feelings, Increase emotional regulation and Identify triggers associated with mental health/substance abuse issues  Therapeutic Interventions: Assess for all discharge needs, 1 to 1 time with Social worker, Explore available resources and support systems, Assess for adequacy in community support network, Educate family and significant other(s) on suicide prevention, Complete Psychosocial Assessment,  Interpersonal group therapy.  Evaluation of Outcomes: Progressing   Progress in Treatment: Attending groups: Yes. Participating in groups: Yes. Taking medication  as prescribed: Yes. Toleration medication: Yes. Family/Significant other contact made: No, will contact:  will contact collateral w patient consent Patient understands diagnosis: Yes. Discussing patient identified problems/goals with staff: Yes. Medical problems stabilized or resolved: Yes. Denies suicidal/homicidal ideation: Yes. contracts for safety on unit Issues/concerns per patient self-inventory: No. Other: NA  New problem(s) identified: No, Describe:  CSW assessing for needs  New Short Term/Long Term Goal(s):   Complete assessment w patient to determine patient needs/goals  Discharge Plan or Barriers: CSW assessing  Reason for Continuation of Hospitalization: Depression Suicidal ideation  Estimated Length of Stay:  3- 5 days, approx DC date 08/25/15  Attendees: Patient: 08/21/2016 2:51 PM  Physician: Gabriel Earing MD 08/21/2016 2:51 PM  Nursing: Elsworth Soho RN 08/21/2016 2:51 PM  RN Care Manager: 08/21/2016 2:51 PM  Social Worker: Eusebio Me 08/21/2016 2:51 PM  Recreational Therapist:  08/21/2016 2:51 PM  Other:  08/21/2016 2:51 PM  Other:  08/21/2016 2:51 PM  Other: 08/21/2016 2:51 PM    Scribe for Treatment Team: Beverely Pace, LCSW 08/21/2016 2:51 PM

## 2016-08-21 NOTE — [facility] Group Notes (Addendum)
[facility] LCSW Group Therapy  08/21/2016 1:15pm  Type of Therapy:  Group Therapy vercoming Obstacles  Participation Level:  Active  Participation Quality:  Appropriate   Affect:  Appropriate  Cognitive:  Appropriate and Oriented  Insight:  Developing/Improving and Improving  Engagement in Therapy:  Improving  Modes of Intervention:  Discussion, Exploration, Problem-solving and Support  Description of Group:   In this group patients will be encouraged to explore what they see as obstacles to their own wellness and recovery. They will be guided to discuss their thoughts, feelings, and behaviors related to these obstacles. The group will process together ways to cope with barriers, with attention given to specific choices patients can make. Each patient will be challenged to identify changes they are motivated to make in order to overcome their obstacles. This group will be process-oriented, with patients participating in exploration of their own experiences as well as giving and receiving support and challenge from other group members.  Summary of Patient Progress: Pt identified her son's illness and guilt related to that as an obstacle. Also, Pt reports that her husband is facing jail time. She expressed that she would like to communicate with her family more intentionally about setting up a support plan for when she discharges.    Therapeutic Modalities:   Cognitive Behavioral Therapy Solution Focused Therapy Motivational Interviewing Relapse Prevention Therapy   Vernie ShanksLauren Zaylia Riolo, LCSW 08/21/2016 4:31 PM
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
## 2016-08-21 NOTE — [facility] Suicide Risk Assessment (Signed)
[facility]   Nursing information obtained from:  Patient Demographic factors:  Caucasian, Unemployed Current Mental Status:  Self-harm behaviors, Belief that plan would result in death Loss Factors:  Loss of significant relationship, Financial problems / change in socioeconomic status, Decline in physical health Historical Factors:  Prior suicide attempts, Family history of mental illness or substance abuse, Impulsivity Risk Reduction Factors:  Responsible for children under 20 years of age, Sense of responsibility to family, Positive social support  Total Time spent with patient: 45 minutes Principal Problem:  MDD Diagnosis:   Patient Active Problem List   Diagnosis Date Noted  . MDD (major depressive disorder) [F32.9] 08/21/2016  . MDD (major depressive disorder), single episode, severe (HCC) [F32.2] 08/20/2016  . Acute pyelonephritis [N10] 06/30/2016  . Pyelonephritis [N12] 06/29/2016  . Streptococcal sepsis, unspecified (HCC) [A40.9] 06/29/2016  . Postpartum state [Z39.2] 06/29/2016  . Abdominal pain [R10.9] 06/29/2016  . Sepsis (HCC) [A41.9]      Continued Clinical Symptoms:  Alcohol Use Disorder Identification Test Final Score (AUDIT): 0 The "Alcohol Use Disorders Identification Test", Guidelines for Use in Primary Care, Second Edition.  World Science writerHealth Organization [facility](WHO). Score between 0-7:  no or low risk or alcohol related problems. Score between 8-15:  moderate risk of alcohol related problems. Score between 16-19:  high risk of alcohol related problems. Score 20 or above:  warrants further diagnostic evaluation for alcohol dependence and treatment.   CLINICAL FACTORS:  21 year old female, presents for depression, status post overdose, in the context of marital stressors and infant son having medical complications from a congenital disease    Psychiatric Specialty Exam: Physical Exam  ROS  Blood pressure 110/73, pulse 87, temperature 98.9 F  (37.2 C), temperature source Oral, resp. rate 20, height 5\' 2"  (1.575 m), weight 92.5 kg (204 lb).Body mass index is 37.31 kg/m.   see admit note MSE    COGNITIVE FEATURES THAT CONTRIBUTE TO RISK:  Closed-mindedness and Loss of executive function    SUICIDE RISK:   Moderate:  Frequent suicidal ideation with limited intensity, and duration, some specificity in terms of plans, no associated intent, good self-control, limited dysphoria/symptomatology, some risk factors present, and identifiable protective factors, including available and accessible social support.  PLAN OF CARE: Patient will be admitted to inpatient psychiatric unit for stabilization and safety. Will provide and encourage milieu participation. Provide medication management and maked adjustments as needed.  Will follow daily.    I certify that inpatient services furnished can reasonably be expected to improve the patient's condition.   Craige CottaFernando A Cobos, MD 08/21/2016, 3:05 PM

## 2016-08-22 MED ORDER — TRAZODONE HCL 50 MG PO TABS
50.0000 mg | ORAL_TABLET | Freq: Every evening | ORAL | Status: DC | PRN
  Administered 2016-08-22 – 2016-08-23 (×2): 50 mg via ORAL
  Filled 2016-08-22 (×8): qty 1

## 2016-08-22 MED ORDER — VENLAFAXINE HCL ER 37.5 MG PO CP24
37.5000 mg | ORAL_CAPSULE | Freq: Every day | ORAL | Status: DC
  Administered 2016-08-23: 37.5 mg via ORAL
  Filled 2016-08-22 (×3): qty 1

## 2016-08-22 NOTE — B[facility] Group Notes (Signed)
[facility] Group Notes:  (Nursing/MHT/Case Management/Adjunct)  Date:  08/22/2016  Time:  0900 am  Type of Therapy:  Psychoeducational Skills  Participation Level:  Did Not Attend   Cranford MonBeaudry, Cayle Cordoba Evans 08/22/2016, 6:08 PM

## 2016-08-22 NOTE — Progress Notes (Signed)
Patient ID: Kaitlyn Moreno, female   DOB: 12-Jun-1995, 21 y.o.   MRN: [id number]  D: Patient denies SI/HI and auditory and visual hallucinations. Patient has a depressed mood and affect.Patient stated that her bedtime medication made her drowsy this AM.  A: Patient given emotional support from RN. Patient given medications per MD orders. Patient encouraged to attend groups and unit activities. Patient encouraged to come to staff with any questions or concerns.  R: Patient remains cooperative and appropriate. Will continue to monitor patient for safety.

## 2016-08-22 NOTE — Progress Notes (Signed)
Adult Psychoeducational Group Note  Date:  08/22/2016 Time:  12:16 AM  Group Topic/Focus:  Wrap-Up Group:   The focus of this group is to help patients review their daily goal of treatment and discuss progress on daily workbooks.  Participation Level:  Active  Participation Quality:  Appropriate, Attentive and Sharing  Affect:  Appropriate  Cognitive:  Appropriate  Insight: Appropriate and Good  Engagement in Group:  Developing/Improving and Engaged  Modes of Intervention:  Discussion, Socialization and Support  Additional Comments: Pt shared during group her goal is to get information for discharge from doctor.  Karleen HampshireFox, Fawaz Borquez Brittini 08/22/2016, 12:16 AM

## 2016-08-22 NOTE — Progress Notes (Signed)
D. Irving Burtonmily had been up and visible in milieu this evening, did attend and participate in group activity. She shared how she found out today that her husband has to go to court on Wednesday and spoke about how she would like discharge either Tuesday or Wednesday so she can be there for him. Irving Burtonmily was able to receive bedtime medication without incident and did not verbalize any complaints of pain. A. Support and encouragement provided. R. Safety maintained, will continue to monitor.

## 2016-08-22 NOTE — Progress Notes (Signed)
[facility] MD Progress Note  08/22/2016 7:59 PM Kaitlyn Moreno  MRN:  [id number] Subjective:   Patient states she is feeling excessively sedated from current medication trial ( Remeron) . Otherwise, she states she is feeling better. She states , however, that she is facing increased psychosocial stressors and that she thinks her husband is being incarcerated . Denies suicidal ideations . Objective : I have discussed case with treatment team and have met with patient . Patient presents with improved mood and range of affect. Affect fully reactive at this time. Although currently fully alert and attentive, states she felt quite sedated this AM and has needed to nap during the day, which she attributes to Remeron trial. We discussed options and she states she prefers to try another antidepressant at this time. No disruptive or agitated behaviors . Denies suicidal ideations .  Principal Problem:  MDD  Diagnosis:   Patient Active Problem List   Diagnosis Date Noted  . MDD (major depressive disorder) [F32.9] 08/21/2016  . MDD (major depressive disorder), single episode, severe ([place]) [F32.2] 08/20/2016  . Acute pyelonephritis [N10] 06/30/2016  . Pyelonephritis [N12] 06/29/2016  . Streptococcal sepsis, unspecified ([place]) [A40.9] 06/29/2016  . Postpartum state [Z39.2] 06/29/2016  . Abdominal pain [R10.9] 06/29/2016  . Sepsis ([place]) [A41.9]    Total Time spent with patient: 20 minutes  Past Medical History:  Past Medical History:  Diagnosis Date  . Anxiety   . Arthritis    "right hip; hands" (06/29/2016)  . Headache    "probably weekly" (06/29/2016)  . Migraine    "probably 2 times/month" (06/29/2016)  . Murmur ~ 2007  . Polo Riley sequence   . Pneumonia    "multiple times" (06/29/2016)  . PONV (postoperative nausea and vomiting)   . Pyelonephritis 06/29/2016  . 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 [place]; ~ 2005  . INTRAUTERINE DEVICE INSERTION  06/2016  . TYMPANOSTOMY TUBE PLACEMENT Bilateral X 7   Family History:  Family History  Problem Relation Age of Onset  . Diabetes Mother   . Stickler syndrome Father    Social History:  History  Alcohol Use No     History  Drug Use No    Social History   Social History  . Marital status: Married    Spouse name: N/A  . Number of children: N/A  . Years of education: N/A   Social History Main Topics  . Smoking status: Never Smoker  . Smokeless tobacco: Never Used  . Alcohol use No  . Drug use: No  . Sexual activity: Yes    Birth control/ protection: Other-see comments, Condom     Comment: IUD placed   Other Topics Concern  . None   Social History Narrative  . None   Additional Social History:    Pain Medications: See MAR Prescriptions: See MAR Over the Counter: See MAR History of alcohol / drug use?: No history of alcohol / drug abuse Longest period of sobriety (when/how long): NA  Sleep: improved   Appetite:  Good  Current Medications: Current Facility-Administered Medications  Medication Dose Route Frequency Provider Last Rate Last Dose  . acetaminophen (TYLENOL) tablet 650 mg  650 mg Oral Q6H PRN Aundra Dubin, MD      . magnesium hydroxide (MILK OF MAGNESIA) suspension 30 mL  30 mL Oral Daily PRN Lulu Riding  Megan Salon, MD      . Derrill Memo ON 08/23/2016] venlafaxine XR (EFFEXOR-XR) 24 hr capsule 37.5 mg  37.5 mg Oral Q breakfast Charlot Gouin, Myer Peer, MD        Lab Results:  Results for orders placed or performed during the hospital encounter of 08/20/16 (from the past 48 hour(s))  Comprehensive metabolic panel     Status: Abnormal   Collection Time: 08/21/16  6:24 AM  Result Value Ref Range   Sodium 139 135 - 145 mmol/L   Potassium 3.7 3.5 - 5.1 mmol/L   Chloride 105 101 - 111 mmol/L   CO2 26 22 - 32 mmol/L   Glucose, Bld 88 65 - 99 mg/dL   BUN 10 6 - 20 mg/dL    Creatinine, Ser 0.67 0.44 - 1.00 mg/dL   Calcium 8.6 (L) 8.9 - 10.3 mg/dL   Total Protein 7.1 6.5 - 8.1 g/dL   Albumin 3.8 3.5 - 5.0 g/dL   AST 22 15 - 41 U/L   ALT 39 14 - 54 U/L   Alkaline Phosphatase 63 38 - 126 U/L   Total Bilirubin 0.3 0.3 - 1.2 mg/dL   GFR calc non Af Amer >60 >60 mL/min   GFR calc Af Amer >60 >60 mL/min    Comment: (NOTE) The eGFR has been calculated using the CKD EPI equation. This calculation has not been validated in all clinical situations. eGFR's persistently <60 mL/min signify possible Chronic Kidney Disease.    Anion gap 8 5 - 15    Comment: Performed at [facility], [place] [address], [place], [place] [address]  CBC     Status: Abnormal   Collection Time: 08/21/16  6:24 AM  Result Value Ref Range   WBC 9.8 4.0 - 10.5 K/uL   RBC 3.95 3.87 - 5.11 MIL/uL   Hemoglobin 11.6 (L) 12.0 - 15.0 g/dL   HCT 35.8 (L) 36.0 - 46.0 %   MCV 90.6 78.0 - 100.0 fL   MCH 29.4 26.0 - 34.0 pg   MCHC 32.4 30.0 - 36.0 g/dL   RDW 15.2 11.5 - 15.5 %   Platelets 399 150 - 400 K/uL    Comment: Performed at [facility], [place] [address] [place], [place] [address]  Pregnancy, urine     Status: None   Collection Time: 08/21/16  6:28 AM  Result Value Ref Range   Preg Test, Ur NEGATIVE NEGATIVE    Comment:        THE SENSITIVITY OF THIS METHODOLOGY IS >20 mIU/mL. Performed at [facility], [place] [address], [place], [place] [address]   Urinalysis, Routine w reflex microscopic     Status: Abnormal   Collection Time: 08/21/16  6:28 AM  Result Value Ref Range   Color, Urine YELLOW YELLOW   APPearance CLOUDY (A) CLEAR   Specific Gravity, Urine 1.021 1.005 - 1.030   pH 6.0 5.0 - 8.0   Glucose, UA NEGATIVE NEGATIVE mg/dL   Hgb urine dipstick NEGATIVE NEGATIVE   Bilirubin Urine NEGATIVE NEGATIVE   Ketones, ur NEGATIVE NEGATIVE mg/dL   Protein, ur 30 (A) NEGATIVE mg/dL   Nitrite NEGATIVE NEGATIVE   Leukocytes, UA SMALL  (A) NEGATIVE   RBC / HPF 6-30 0 - 5 RBC/hpf   WBC, UA 6-30 0 - 5 WBC/hpf   Bacteria, UA RARE (A) NONE SEEN   Squamous Epithelial / LPF TOO NUMEROUS TO COUNT (A) NONE SEEN   Mucous PRESENT  Comment: Performed at [facility], [place] [address], [place], [place] [address]    Blood Alcohol level:  No results found for: [facility]  Metabolic Disorder Labs: No results found for: HGBA1C, MPG No results found for: PROLACTIN No results found for: CHOL, TRIG, HDL, CHOLHDL, VLDL, LDLCALC  Physical Findings: AIMS: Facial and Oral Movements Muscles of Facial Expression: None, normal Lips and Perioral Area: None, normal Jaw: None, normal Tongue: None, normal,Extremity Movements Upper (arms, wrists, hands, fingers): None, normal Lower (legs, knees, ankles, toes): None, normal, Trunk Movements Neck, shoulders, hips: None, normal, Overall Severity Severity of abnormal movements (highest score from questions above): None, normal Incapacitation due to abnormal movements: None, normal Patient's awareness of abnormal movements (rate only patient's report): No Awareness, Dental Status Current problems with teeth and/or dentures?: No Does patient usually wear dentures?: No  CIWA:    COWS:     Musculoskeletal: Strength & Muscle Tone: within normal limits Gait & Station: normal Patient leans: N/A  Psychiatric Specialty Exam: Physical Exam  ROS denies chest pain, no shortness of breath, no vomiting , no fever   Blood pressure 121/71, pulse 60, temperature 98.3 F (36.8 C), resp. rate 16, height 5' 2" (1.575 m), weight 92.5 kg (204 lb).Body mass index is 37.31 kg/m.  General Appearance: Well Groomed  Eye Contact:  Good  Speech:  Normal Rate  Volume:  Normal  Mood:  reports she is feeling better   Affect:  reactive, smiles at times appropriately   Thought Process:  Linear and Descriptions of Associations: Intact  Orientation:  Other:  fully alert and attentive   Thought Content:   denies hallucinations, no delusions   Suicidal Thoughts:  No currently denies suicidal or self injurious plans or intentions and contracts for safety on unit   Homicidal Thoughts:  No  Memory:  recent and remote grossly intact  Judgement:  Fair- improving   Insight:  Fair- improving   Psychomotor Activity:  Normal  Concentration:  Concentration: Good and Attention Span: Good  Recall:  Good  Fund of Knowledge:  Good  Language:  Negative  Akathisia:  No  Handed:  Right  AIMS (if indicated):     Assets:  Communication Skills Desire for Improvement Resilience  ADL's:  Intact  Cognition:  WNL  Sleep:  Number of Hours: 6.25   Assessment -  21 year old female, presented for depression , status post overdose. Facing significant psychosocial stressors, and today states that she has found out that her husband is likely being incarcerated. States, however, that she is feeling better, and currently presents with fuller range of affect and denies any SI. States she did not tolerate REMERON trial well due to excessive sedation and wants another medication/ antidepressant trial.  Treatment Plan Summary: Daily contact with patient to assess and evaluate symptoms and progress in treatment, Medication management, Plan inpatient admission and medications as below Encourage group and milieu participation to work on coping skills and symptom reduction  D/C Remeron- see above  Start Effexor XR 37.5 mgrs QAM- side effects reviewed- of note,  this medication is not listed as interacting with red dye allergy . Patient reports history of rash from red dye.  Treatment team working on disposition planning options . Jenne Campus, MD 08/22/2016, 7:59 PM

## 2016-08-22 NOTE — [facility] Group Notes (Signed)
[facility] LCSW Group Therapy 08/22/2016 1:15 PM  Type of Therapy: Group Therapy- Feelings about Diagnosis  Participation Level: Pt was present for the duration of the group. Slept the entire time.  Jonathon JordanLynn B Anshul Meddings, MSW, Theresia MajorsLCSWA [phone] 08/22/2016 4:40 PM

## 2016-08-22 NOTE — Progress Notes (Signed)
Recreation Therapy Notes  Animal-Assisted Activity (AAA) Program Checklist/Progress Notes Patient Eligibility Criteria Checklist & Daily Group note for Rec TxIntervention  Date: 08/22/2016 Time: 2:55pm Location: 400 hall dayroom  AAA/T Program Assumption of Risk Form signed by Patient/ or Parent Legal Guardian Yes  Patient is free of allergies or sever asthma Yes  Patient reports no fear of animals Yes  Patient reports no history of cruelty to animals Yes  Patient understands his/her participation is voluntary Yes  Patient washes hands before animal contact Yes  Patient washes hands after animal contact Yes  Behavioral Response: Patient did not attend.  Izreal Kock, Recreation Therapy Intern 

## 2016-08-23 MED ORDER — VENLAFAXINE HCL ER 75 MG PO CP24
75.0000 mg | ORAL_CAPSULE | Freq: Every day | ORAL | Status: DC
  Administered 2016-08-24: 75 mg via ORAL
  Filled 2016-08-23 (×3): qty 1

## 2016-08-23 NOTE — Progress Notes (Signed)
Patient ID: Kaitlyn Moreno, female   DOB: Feb 01, 1996, 21 y.o.   MRN: [id number]  DAR: Pt. Denies SI/HI and A/V Hallucinations. She reports sleep is fair, appetite is good, energy level is high, and concentration is good. She rates depression 0/10, hopelessness 2/10, and anxiety 3/10. Patient does report "joint pain" but refuses intervention at this time. She presents with a fixed smile and pleasant mood while interacting with this Clinical research associatewriter. Support and encouragement provided to the patient. On her daily inventory sheet she reports that she would like to speak with a Child psychotherapistsocial worker, LCSW Larita FifeLynn was notified of this. Scheduled medications administered to patient per physician's orders. Patient is minimal with Clinical research associatewriter but cooperative. She is seen in the milieu and is interacting with her peers appropriately. This afternoon her mother visited on the unit. It appeared to be a positive visit for the patient. Q15 minute checks are maintained for safety.

## 2016-08-23 NOTE — Progress Notes (Signed)
[facility] MD Progress Note  08/23/2016 5:19 PM Kaitlyn Moreno  MRN:  [id number] Subjective:   Patient reports she is feeling better today. As she improves she is focusing more on disposition planning . Denies medication side effects and today not endorsing sedation.   Objective : I have discussed case with treatment team and have met with patient . Today patient is presenting with improved mood and affect. Her mother is visiting her on unit and provides collateral information. She corroborates that patient seems improved , and agrees with discharge planning . Major stressors include husband's recent incarceration and infant child having temporary tracheostomy to help manage congenital illness . Mother is very supportive, and patient plans to go live with mother after discharge. At this time denies medication side effects and presents fully alert and attentive , no current sedation. She has been going to groups, interacting with selected peers.    Principal Problem:  MDD  Diagnosis:   Patient Active Problem List   Diagnosis Date Noted  . MDD (major depressive disorder) [F32.9] 08/21/2016  . MDD (major depressive disorder), single episode, severe ([place]) [F32.2] 08/20/2016  . Acute pyelonephritis [N10] 06/30/2016  . Pyelonephritis [N12] 06/29/2016  . Streptococcal sepsis, unspecified ([place]) [A40.9] 06/29/2016  . Postpartum state [Z39.2] 06/29/2016  . Abdominal pain [R10.9] 06/29/2016  . Sepsis ([place]) [A41.9]    Total Time spent with patient: 20 minutes  Past Medical History:  Past Medical History:  Diagnosis Date  . Anxiety   . Arthritis    "right hip; hands" (06/29/2016)  . Headache    "probably weekly" (06/29/2016)  . Migraine    "probably 2 times/month" (06/29/2016)  . Murmur ~ 2007  . Polo Riley sequence   . Pneumonia    "multiple times" (06/29/2016)  . PONV (postoperative nausea and vomiting)   . Pyelonephritis 06/29/2016  . Stickler syndrome    "body cannot produce enough  collegen; affects all the body"    Past Surgical History:  Procedure Laterality Date  . [place]; ~ 2005  . INTRAUTERINE DEVICE INSERTION  06/2016  . TYMPANOSTOMY TUBE PLACEMENT Bilateral X 7   Family History:  Family History  Problem Relation Age of Onset  . Diabetes Mother   . Stickler syndrome Father    Social History:  History  Alcohol Use No     History  Drug Use No    Social History   Social History  . Marital status: Married    Spouse name: N/A  . Number of children: N/A  . Years of education: N/A   Social History Main Topics  . Smoking status: Never Smoker  . Smokeless tobacco: Never Used  . Alcohol use No  . Drug use: No  . Sexual activity: Yes    Birth control/ protection: Other-see comments, Condom     Comment: IUD placed   Other Topics Concern  . None   Social History Narrative  . None   Additional Social History:    Pain Medications: See MAR Prescriptions: See MAR Over the Counter: See MAR History of alcohol / drug use?: No history of alcohol / drug abuse Longest period of sobriety (when/how long): NA  Sleep: Good  Appetite:  Good  Current Medications: Current Facility-Administered Medications  Medication Dose Route Frequency Provider Last Rate Last Dose  . acetaminophen (TYLENOL) tablet 650 mg  650 mg Oral Q6H PRN Aundra Dubin, MD      . magnesium hydroxide (MILK OF MAGNESIA) suspension 30  mL  30 mL Oral Daily PRN Aundra Dubin, MD      . traZODone (DESYREL) tablet 50 mg  50 mg Oral QHS,MR X 1 Laverle Hobby, PA-C   50 mg at 08/22/16 2251  . venlafaxine XR (EFFEXOR-XR) 24 hr capsule 37.5 mg  37.5 mg Oral Q breakfast Nikolaj Geraghty, Myer Peer, MD   37.5 mg at 08/23/16 8756    Lab Results:  No results found for this or any previous visit (from the past 48 hour(s)).  Blood Alcohol level:  No results found for: [facility]  Metabolic Disorder Labs: No results found for: HGBA1C, MPG No results found for:  PROLACTIN No results found for: CHOL, TRIG, HDL, CHOLHDL, VLDL, LDLCALC  Physical Findings: AIMS: Facial and Oral Movements Muscles of Facial Expression: None, normal Lips and Perioral Area: None, normal Jaw: None, normal Tongue: None, normal,Extremity Movements Upper (arms, wrists, hands, fingers): None, normal Lower (legs, knees, ankles, toes): None, normal, Trunk Movements Neck, shoulders, hips: None, normal, Overall Severity Severity of abnormal movements (highest score from questions above): None, normal Incapacitation due to abnormal movements: None, normal Patient's awareness of abnormal movements (rate only patient's report): No Awareness, Dental Status Current problems with teeth and/or dentures?: No Does patient usually wear dentures?: No  CIWA:    COWS:     Musculoskeletal: Strength & Muscle Tone: within normal limits Gait & Station: normal Patient leans: N/A  Psychiatric Specialty Exam: Physical Exam  ROS denies chest pain, no shortness of breath, no vomiting , no fever   Blood pressure 107/66, pulse 71, temperature 98.2 F (36.8 C), temperature source Oral, resp. rate 18, height '5\' 2"'$  (1.575 m), weight 92.5 kg (204 lb).Body mass index is 37.31 kg/m.  General Appearance: Well Groomed  Eye Contact:  Good  Speech:  Normal Rate  Volume:  Normal  Mood:  improving and presents euthymic  Affect:  appropriate, reactive  Thought Process:  Goal Directed and Descriptions of Associations: Intact  Orientation:  Other:  fully alert and attentive   Thought Content:  denies hallucinations, no delusions   Suicidal Thoughts:  No currently denies suicidal or self injurious plans or intentions and contracts for safety on unit   Homicidal Thoughts:  No  Memory:  recent and remote grossly intact  Judgement:  improving   Insight:  improving   Psychomotor Activity:  Normal  Concentration:  Concentration: Good and Attention Span: Good  Recall:  Good  Fund of Knowledge:  Good   Language:  Negative  Akathisia:  No  Handed:  Right  AIMS (if indicated):     Assets:  Communication Skills Desire for Improvement Resilience  ADL's:  Intact  Cognition:  WNL  Sleep:  Number of Hours: 6.25   Assessment -  Patient is reporting feeling better, and at this time presents euthymic. Mother corroborates patient's improvement and readiness to focus on discharge planning . Thus far is tolerating medications well, denies side effects. Has had no adverse reaction to Effexor XR thus far.   Treatment Plan Summary: Daily contact with patient to assess and evaluate symptoms and progress in treatment, Medication management, Plan inpatient admission and medications as below Encourage group and milieu participation to work on coping skills and symptom reduction  Increase  Effexor XR to 75 mgrs QAM for depression and anxiety Treatment team working on disposition planning options . Jenne Campus, MD 08/23/2016, 5:19 PM   Patient ID: Skeet Latch, female   DOB: 19-Jan-1996, 21 y.o.   MRN:  [id number]  

## 2016-08-23 NOTE — Progress Notes (Signed)
Recreation Therapy Notes  Date: 08/23/16 Time: 0930 Location: 300 Hall Dayroom  Group Topic: Stress Management  Goal Area(s) Addresses:  Patient will verbalize importance of using healthy stress management.  Patient will identify positive emotions associated with healthy stress management.   Intervention: Stress Managemen  Activity :  LRT introduced the stress management technique of guided imagery.  LRT read a script to allow patients to engage in the activity.  Patients were to follow along as LRT read script to participate in activity.  Education:  Stress Management, Discharge Planning.   Education Outcome: Acknowledges edcuation/In group clarification offered/Needs additional education  Clinical Observations/Feedback: Pt did not attend group.   Caroll RancherMarjette Onnika Siebel, LRT/CTRS         Caroll RancherLindsay, Masie Bermingham A 08/23/2016 2:45 PM

## 2016-08-23 NOTE — Progress Notes (Signed)
Pt reports that she had a fairly good day.  She said most of the day she was groggy from the sleep aid that she took last night, so she is not interested in a sleep aid tonight.  She denies SI/HI/AVH.  She voices no other needs or concerns this evening.  She was helpful to her roommate who was having a difficult time after a bad phone call this evening.  Pt's plans are to return home at discharge. Support and encouragement offered.  Discharge plans are in process.  Safety maintained with q15 minute checks.

## 2016-08-23 NOTE — [facility] Group Notes (Signed)
[facility] 08/23/2016 1:15pm  Type of Therapy: Mental Health Association Presentation  Participation Level: Active  Participation Quality: Attentive  Affect: Appropriate  Cognitive: Oriented  Insight: Developing/Improving  Engagement in Therapy: Engaged  Modes of Intervention: Discussion, Education and Socialization  Summary of Progress/Problems: Mental Health Association (MHA) Speaker came to talk about his personal journey with substance abuse and addiction. The pt processed ways by which to relate to the speaker. MHA speaker provided handouts and educational information pertaining to groups and services offered by [facility]. Pt was engaged in speaker's presentation and was receptive to resources provided.    Kaitlyn BackersLynn B. Beverely Moreno, MSW, [facility]WA 08/23/2016 3:35 PM

## 2016-08-23 NOTE — Progress Notes (Signed)
Adult Psychoeducational Group Note  Date:  08/23/2016 Time:  5:56 AM  Group Topic/Focus:  Wrap-Up Group:   The focus of this group is to help patients review their daily goal of treatment and discuss progress on daily workbooks.  Participation Level:  Active  Participation Quality:  Appropriate, Attentive, Sharing and Supportive  Affect:  Appropriate  Cognitive:  Appropriate  Insight: Appropriate and Good  Engagement in Group:  Developing/Improving  Modes of Intervention:  Discussion, Socialization and Support  Additional Comments:  Pt shared during group that her goal was to work on her social anxiety.  Pt shared she "does not do well in large groups." Pt shared she is getting better with being around people more and talking.  Karleen HampshireFox, Eliska Hamil Brittini 08/23/2016, 5:56 AM

## 2016-08-24 MED ORDER — VENLAFAXINE HCL ER 75 MG PO CP24: 75.0000 mg | ORAL_CAPSULE | Freq: Every day | ORAL | 0 refills | Status: DC

## 2016-08-24 MED ORDER — TRAZODONE HCL 50 MG PO TABS: ORAL_TABLET | ORAL | 0 refills | Status: AC

## 2016-08-24 NOTE — Progress Notes (Signed)
Patient ID: Kaitlyn Moreno, female   DOB: [birth year], 21 y.o.   MRN: [id number]  Discharge Note: Belongings retrieved from bedside by patient at time of discharge.  Discharge instructions and medications were reviewed with patient. Patient verbalized understanding of both medications and discharge instructions. Patient discharged to lobby where her ride was there to pick her up. Q15 minute safety checks maintained until discharge. No distress upon discharge.

## 2016-08-24 NOTE — [facility] Suicide Risk Assessment (Signed)
[facility] INPATIENT:  Family/Significant Other Suicide Prevention Education  Suicide Prevention Education:  Education Completed; Kaitlyn Moreno (mother [phone]), has been identified by the patient as the family member/significant other with whom the patient will be residing, and identified as the person(s) who will aid the patient in the event of a mental health crisis (suicidal ideations/suicide attempt).  With written consent from the patient, the family member/significant other has been provided the following suicide prevention education, prior to the and/or following the discharge of the patient.  The suicide prevention education provided includes the following:  Suicide risk factors  Suicide prevention and interventions  National Suicide Hotline telephone number  [facility] assessment telephone number  [facility] Emergency Assistance 911  [facility] and/or Residential Mobile Crisis Unit telephone number  Request made of family/significant other to:  Remove weapons (e.g., guns, rifles, knives), all items previously/currently identified as safety concern.    Remove drugs/medications (over-the-counter, prescriptions, illicit drugs), all items previously/currently identified as a safety concern.  The family member/significant other verbalizes understanding of the suicide prevention education information provided.  The family member/significant other agrees to remove the items of safety concern listed above.  Pt's mother states that pt's husband's legal troubles are in the paper and on TV and she is concerned about how to protect the pt's mental health once she has to see all of these things. CSW explained warning signs of suicide and suggested ways that pt's mother could be supportive to the pt during this time. Pt's mother expressed her understanding and states that she will have a family member to stay with the pt during the day for a few days until pt is feeling  more like herself. Pt's mother states that she believes pt's symptoms can be safely managed on an outpatient basis with the the proper supports. Pt's mother also confirmed that there are no guns or weapons in the home.  Kaitlyn Moreno, MSW, LCSWA  08/24/2016, 10:46 AM

## 2016-08-24 NOTE — Progress Notes (Signed)
Patient ID: Kaitlyn Moreno, female   DOB: 05-17-1995, 21 y.o.   MRN: [id number]  DAR: Pt. Denies SI/HI and A/V Hallucinations. She reports sleep is good, appetite is good, energy level is normal, and concentration is good. She rates depression 0/10, hopelessness 2/10, and anxiety 4/10. Patient does not report any pain or discomfort at this time. Support and encouragement provided to the patient. Scheduled medication administered to patient per physician's orders. Patient is minimal with Clinical research associatewriter but cooperative. She is seen minimally in the milieu. She reports she feels ready for discharge today. Q15 minute checks are maintained for safety.

## 2016-08-24 NOTE — [facility] Suicide Risk Assessment (Signed)
[facility] Discharge Suicide Risk Assessment   Principal Problem: Depression Discharge Diagnoses:  Patient Active Problem List   Diagnosis Date Noted  . MDD (major depressive disorder) [F32.9] 08/21/2016  . MDD (major depressive disorder), single episode, severe (HCC) [F32.2] 08/20/2016  . Acute pyelonephritis [N10] 06/30/2016  . Pyelonephritis [N12] 06/29/2016  . Streptococcal sepsis, unspecified (HCC) [A40.9] 06/29/2016  . Postpartum state [Z39.2] 06/29/2016  . Abdominal pain [R10.9] 06/29/2016  . Sepsis (HCC) [A41.9]     Total Time spent with patient: 30 minutes  Musculoskeletal: Strength & Muscle Tone: within normal limits Gait & Station: normal Patient leans: N/A  Psychiatric Specialty Exam: ROS  No chest pain, no dyspnea   Blood pressure 110/76, pulse (!) 104, temperature 98.6 F (37 C), resp. rate 16, height 5\' 2"  (1.575 m), weight 92.5 kg (204 lb).Body mass index is 37.31 kg/m.  General Appearance: Well Groomed  Eye Contact::  Good  Speech:  Normal Rate409  Volume:  Normal  Mood:  improved, presents euthymic   Affect:  Appropriate and Full Range  Thought Process:  Linear  Orientation:  Full (Time, Place, and Person)  Thought Content:  denies hallucinations, no delusions,not internally preoccupied   Suicidal Thoughts:  No denies any suicidal or self injurious ideations  Homicidal Thoughts:  No denies homicidal or violent ideations   Memory:  recent and remote grossly intact   Judgement:  Other:  improving   Insight:  improving   Psychomotor Activity:  Normal  Concentration:  Good  Recall:  Good  Fund of Knowledge:Good  Language: Good  Akathisia:  Negative  Handed:  Right  AIMS (if indicated):     Assets:  Communication Skills Desire for Improvement Resilience  Sleep:  Number of Hours: 5.75  Cognition: WNL  ADL's:  Intact   Mental Status Per Nursing Assessment::   On Admission:  Self-harm behaviors, Belief that plan would result in death  Demographic  Factors:  21 year old  married female, has two children   Loss Factors: Infant child has congenital illness and has tracheostomy, husband is in legal trouble   Historical Factors: No prior psychiatric admissions , has never attempted suicide in the past   Risk Reduction Factors:   Responsible for children under 92 years of age, Sense of responsibility to family, Living with another person, especially a relative, Positive social support and Positive coping skills or problem solving skills  Continued Clinical Symptoms:  At this time patient is alert , attentive, well related, pleasant, mood is reported as improved, and presents euthymic, affect is appropriate and bright, no thought disorder, no suicidal or self injurious ideations, no homicidal or violent ideations, no hallucinations, no delusions, future oriented . Denies medication side effects. Behavior on unit calm and in good control.  Cognitive Features That Contribute To Risk:  No gross cognitive deficits noted upon discharge. Is alert , attentive, and oriented x 3   Suicide Risk:  Mild   Follow-up Information    [facility] Follow up on 08/25/2016.   Why:  Hospital follow-up appointment at 12:15pm. Please bring a photo ID, your social security card, insurance card, and proof of income if you have it. Thank you. Contact information: [address] [place] [address] [phone]           Plan Of Care/Follow-up recommendations:  Activity:  as tolerated  Diet:  Regular Tests:  NA Other:  See below  Patient is requesting discharge and there are no current grounds for  involuntary commitment  She is leaving unit in good spirits  Plans to live with her mother, who is very supportive, and helps her with children's care  Plans to follow up with her PCP for medical issues as needed  Plans to follow up as above   Craige CottaFernando A Cobos, MD 08/24/2016, 11:04 AM

## 2016-08-24 NOTE — Tx Team (Signed)
Interdisciplinary Treatment and Diagnostic Plan Update  08/24/2016 Time of Session: 9:30 AM Kaitlyn Moreno MRN: [id number]  Principal Diagnosis: Suicide Attempt by Overdose   Secondary Diagnoses: Active Problems:   MDD (major depressive disorder), single episode, severe (HCC)   MDD (major depressive disorder)   Current Medications:  Current Facility-Administered Medications  Medication Dose Route Frequency Provider Last Rate Last Dose  . acetaminophen (TYLENOL) tablet 650 mg  650 mg Oral Q6H PRN Burnard Leigh, MD      . magnesium hydroxide (MILK OF MAGNESIA) suspension 30 mL  30 mL Oral Daily PRN Burnard Leigh, MD      . traZODone (DESYREL) tablet 50 mg  50 mg Oral QHS,MR X 1 Donell Sievert E, PA-C   50 mg at 08/23/16 2310  . venlafaxine XR (EFFEXOR-XR) 24 hr capsule 75 mg  75 mg Oral Q breakfast Cobos, Rockey Situ, MD   75 mg at 08/24/16 0813   PTA Medications: Prescriptions Prior to Admission  Medication Sig Dispense Refill Last Dose  . Prenatal Vit-Fe Fumarate-FA (PRENATAL MULTIVITAMIN) TABS tablet Take 1 tablet by mouth daily at 12 noon.   Taking    Patient Stressors: Health problems Marital or family conflict Substance abuse  Patient Strengths: Ability for insight Average or above average intelligence General fund of knowledge Motivation for treatment/growth Supportive family/friends  Treatment Modalities: Medication Management, Group therapy, Case management,  1 to 1 session with clinician, Psychoeducation, Recreational therapy.   Physician Treatment Plan for Primary Diagnosis: Suicide Attempt by Overdose  Long Term Goal(s): Improvement in symptoms so as ready for discharge Improvement in symptoms so as ready for discharge   Short Term Goals: Ability to verbalize feelings will improve Ability to disclose and discuss suicidal ideas Ability to demonstrate self-control will improve Ability to identify and develop effective coping behaviors will  improve Ability to maintain clinical measurements within normal limits will improve Ability to verbalize feelings will improve Ability to disclose and discuss suicidal ideas Ability to demonstrate self-control will improve Ability to identify and develop effective coping behaviors will improve Ability to maintain clinical measurements within normal limits will improve  Medication Management: Evaluate patient's response, side effects, and tolerance of medication regimen.  Therapeutic Interventions: 1 to 1 sessions, Unit Group sessions and Medication administration.  Evaluation of Outcomes: Adequate for Discharge  Physician Treatment Plan for Secondary Diagnosis: Active Problems:   MDD (major depressive disorder), single episode, severe (HCC)   MDD (major depressive disorder)  Long Term Goal(s): Improvement in symptoms so as ready for discharge Improvement in symptoms so as ready for discharge   Short Term Goals: Ability to verbalize feelings will improve Ability to disclose and discuss suicidal ideas Ability to demonstrate self-control will improve Ability to identify and develop effective coping behaviors will improve Ability to maintain clinical measurements within normal limits will improve Ability to verbalize feelings will improve Ability to disclose and discuss suicidal ideas Ability to demonstrate self-control will improve Ability to identify and develop effective coping behaviors will improve Ability to maintain clinical measurements within normal limits will improve     Medication Management: Evaluate patient's response, side effects, and tolerance of medication regimen.  Therapeutic Interventions: 1 to 1 sessions, Unit Group sessions and Medication administration.  Evaluation of Outcomes: Adequate for Discharge   RN Treatment Plan for Primary Diagnosis: Suicide Attempt by Overdose  Long Term Goal(s): Knowledge of disease and therapeutic regimen to maintain health will  improve  Short Term Goals: Ability to demonstrate self-control, Ability  to verbalize feelings will improve and Ability to disclose and discuss suicidal ideas  Medication Management: RN will administer medications as ordered by provider, will assess and evaluate patient's response and provide education to patient for prescribed medication. RN will report any adverse and/or side effects to prescribing provider.  Therapeutic Interventions: 1 on 1 counseling sessions, Psychoeducation, Medication administration, Evaluate responses to treatment, Monitor vital signs and CBGs as ordered, Perform/monitor CIWA, COWS, AIMS and Fall Risk screenings as ordered, Perform wound care treatments as ordered.  Evaluation of Outcomes: Adequate for Discharge   LCSW Treatment Plan for Primary Diagnosis: Suicide Attempt by Overdose  Long Term Goal(s): Safe transition to appropriate next level of care at discharge, Engage patient in therapeutic group addressing interpersonal concerns.  Short Term Goals: Engage patient in aftercare planning with referrals and resources, Increase ability to appropriately verbalize feelings, Increase emotional regulation and Identify triggers associated with mental health/substance abuse issues  Therapeutic Interventions: Assess for all discharge needs, 1 to 1 time with Social worker, Explore available resources and support systems, Assess for adequacy in community support network, Educate family and significant other(s) on suicide prevention, Complete Psychosocial Assessment, Interpersonal group therapy.  Evaluation of Outcomes: Adequate for Discharge   Progress in Treatment: Attending groups: Yes. Participating in groups: Yes. Taking medication as prescribed: Yes. Toleration medication: Yes. Family/Significant other contact made: Yes, pt's mother contacted. Patient understands diagnosis: Yes. Discussing patient identified problems/goals with staff: Yes. Medical problems  stabilized or resolved: Yes. Denies suicidal/homicidal ideation: Yes. contracts for safety on unit Issues/concerns per patient self-inventory: No. Other: NA  New problem(s) identified:   New Short Term/Long Term Goal(s):    Discharge Plan or Barriers: Pt will return home and [facility]  Reason for Continuation of Hospitalization: None identified at this time  Estimated Length of Stay:  0 days, pt will discharge today 6/14  Attendees: Patient: 08/24/2016 11:03 AM  Physician: Sallyanne HaversF Cobos MD 08/24/2016 11:03 AM  Nursing: Rayfield Citizenaroline, RN; Christa, RN 08/24/2016 11:03 AM  RN Care Manager: 08/24/2016 11:03 AM  Social Worker: Donnelly StagerLynn Habib Kise, LCSWA 08/24/2016 11:03 AM  Recreational Therapist:  08/24/2016 11:03 AM  Other:  08/24/2016 11:03 AM  Other:  08/24/2016 11:03 AM  Other: 08/24/2016 11:03 AM    Scribe for Treatment Team: Jonathon JordanLynn B Keleigh Kazee, LCSWA 08/24/2016 11:03 AM

## 2016-08-24 NOTE — Progress Notes (Signed)
  [facility] :  Will you be returning to the same living situation after discharge:  Yes,  pt will return home. At discharge, do you have transportation home?: Yes,  pt's mother will transport. Do you have the ability to pay for your medications: Yes,  pt has insurance.  Release of information consent forms completed and in the chart;  Patient's signature needed at discharge.  Patient to Follow up at: Follow-up Information    [facility] Follow up on 08/25/2016.   Why:  Hospital follow-up appointment at 12:15pm. Please bring a photo ID, your social security card, insurance card, and proof of income if you have it. Thank you. Contact information: [address] [place] [id number] [phone]           Next level of care provider has access to [facility] Link:no  Safety Planning and Suicide Prevention discussed: Yes,  with pt and with pt's mother.  Have you used any form of tobacco in the last 30 days? (Cigarettes, Smokeless Tobacco, Cigars, and/or Pipes): No  Has patient been referred to the Quitline?: N/A patient is not a smoker  Patient has been referred for addiction treatment: Yes  Jonathon JordanLynn B Danielle Lento, MSW, LCSWA  08/24/2016, 11:06 AM

## 2016-08-24 NOTE — Progress Notes (Signed)
Pt did not attend group. 

## 2016-08-24 NOTE — Progress Notes (Signed)
Kaitlyn Moreno had been up and visible in milieu this evening, did attend evening group activity. She was seen interacting appropriately with peers in milieu and spoke about how she is to be discharged in the morning. She spoke about how she is feeling better and spoke about how she feels like she is in a good place for discharge. She was able to receive bedtime medication without incident and did not verbalize any complaints of pain. A. Support and encouragement provided. R. Safety maintained, will continue to monitor.

## 2016-08-24 NOTE — Discharge Summary (Signed)
Physician Discharge Summary Note  Patient:  Kaitlyn Moreno is an 21 y.o., female MRN:  [id number] DOB:  [birth year] Patient phone:  [phone] (home)  Patient address:   [address]  [place] [id number],  Total Time spent with patient: Greater than 30 minutes  Date of Admission:  08/20/2016 Date of Discharge: 08-24-16  Reason for Admission: Suicide attempt by overdose.  Principal Problem: Major depressive disorder, single episode, severe  \Discharge Diagnoses: Patient Active Problem List   Diagnosis Date Noted  . MDD (major depressive disorder) [F32.9] 08/21/2016  . MDD (major depressive disorder), single episode, severe (HCC) [F32.2] 08/20/2016  . Acute pyelonephritis [N10] 06/30/2016  . Pyelonephritis [N12] 06/29/2016  . Streptococcal sepsis, unspecified (HCC) [A40.9] 06/29/2016  . Postpartum state [Z39.2] 06/29/2016  . Abdominal pain [R10.9] 06/29/2016  . Sepsis (HCC) [A41.9]    Past Psychiatric History: Major depressive disorder, single episode, severe.  Past Medical History:  Past Medical History:  Diagnosis Date  . Anxiety   . Arthritis    "right hip; hands" (06/29/2016)  . Headache    "probably weekly" (06/29/2016)  . Migraine    "probably 2 times/month" (06/29/2016)  . Murmur ~ 2007  . Otilio JeffersonPierre Robin sequence   . Pneumonia    "multiple times" (06/29/2016)  . PONV (postoperative nausea and vomiting)   . Pyelonephritis 06/29/2016  . 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 CLEFT PALATE REPAIR  1999; ~ 2005  . INTRAUTERINE DEVICE INSERTION  06/2016  . TYMPANOSTOMY TUBE PLACEMENT Bilateral X 7   Family History:  Family History  Problem Relation Age of Onset  . Diabetes Mother   . Stickler syndrome Father    Family Psychiatric  History: See Md's SRA  Social History:  History  Alcohol Use No     History  Drug Use No    Social History   Social  History  . Marital status: Married    Spouse name: N/A  . Number of children: N/A  . Years of education: N/A   Social History Main Topics  . Smoking status: Never Smoker  . Smokeless tobacco: Never Used  . Alcohol use No  . Drug use: No  . Sexual activity: Yes    Birth control/ protection: Other-see comments, Condom     Comment: IUD placed   Other Topics Concern  . None   Social History Narrative  . None   Hospital Course: 21 year old female, has two children. States Kaitlyn Moreno has a 4 month child. Kaitlyn Moreno states Kaitlyn Moreno has had significant stressors recently. Kaitlyn Moreno reports that her infant child has congenital illness (Stickler Syndrome) , and has a permanent tracheostomy. States that the child had recently somehow pulled trach. Tube out, and Kaitlyn Moreno had to revive him and called EMS. At this time child is stable. Kaitlyn Moreno also recently found out that her husband has been unfaithful, and is facing incarceration due to sexual related criminal charges .States Kaitlyn Moreno impulsively overdosed on Lexapro - states Kaitlyn Moreno took 4 or 5 tablets , which Kaitlyn Moreno had from an old prescription Kaitlyn Moreno was no longer taking . Kaitlyn Moreno states Kaitlyn Moreno regretted this " almost as soon as I did it", and told her husband, resulting in going to ED . Overdose occurred 2 days.  After the above admission assessment, Kaitlyn Moreno was started on medication regimen for her presenting symptoms. Kaitlyn Moreno was medicated & discharged on; Effexor XR 75  mg for depression & Trazodone 50 mg for insomnia. Kaitlyn Moreno was also enrolled & participated in the group counseling sessions being offered & held on this unit. Kaitlyn Moreno learned coping skills that should help her after discharge to cope better & maintain moos stability. Kaitlyn Moreno presented no other significant health issues that required treatment & or monitoring.  As her treatment progressed, daily assessment notes marked improvement in her symptoms. Feels good.  Plans to engage with the outpatient psychiatric services as recommended below.  Kaitlyn Moreno is  optimistic about the future. No  psychotic features. No anxiety. Features of depression has markedly improved since being in the hospital and starting medications. No thoughts of violence. No access to weapons. No new stressors. No family history of suicide.  Kaitlyn Moreno's case was presented during treatment team meeting this morning. The nursing staff reports that patient has been appropriate on the unit. Patient has been interacting well with peers. No behavioral issues. Patient has not voiced any suicidal thoughts. Patient has not been observed to be internally stimulated or pre-occupied. Patient has been adherent with treatment recommendations. Patient has been tolerating their medication well.   Kaitlyn Moreno is currently being discharged to her home with family. Kaitlyn Moreno left [facility] with all personal belongings in no apparent distress. Transportation per mother.  Physical Findings: AIMS: Facial and Oral Movements Muscles of Facial Expression: None, normal Lips and Perioral Area: None, normal Jaw: None, normal Tongue: None, normal,Extremity Movements Upper (arms, wrists, hands, fingers): None, normal Lower (legs, knees, ankles, toes): None, normal, Trunk Movements Neck, shoulders, hips: None, normal, Overall Severity Severity of abnormal movements (highest score from questions above): None, normal Incapacitation due to abnormal movements: None, normal Patient's awareness of abnormal movements (rate only patient's report): No Awareness, Dental Status Current problems with teeth and/or dentures?: No Does patient usually wear dentures?: No  CIWA:    COWS:     Musculoskeletal: Strength & Muscle Tone: within normal limits Gait & Station: normal Patient leans: N/A  Psychiatric Specialty Exam: Physical Exam  Constitutional: Kaitlyn Moreno is oriented to person, place, and time. Kaitlyn Moreno appears well-developed.  HENT:  Head: Normocephalic.  Eyes: Pupils are equal, round, and reactive to light.  Cardiovascular: Normal rate.    Respiratory: Effort normal.  GI: Soft.  Genitourinary:  Genitourinary Comments: Deferred  Musculoskeletal: Normal range of motion.  Neurological: Kaitlyn Moreno is alert and oriented to person, place, and time.  Skin: Skin is warm and dry.    Review of Systems  Constitutional: Negative.   HENT: Negative.   Eyes: Negative.   Respiratory: Negative.   Cardiovascular: Negative.   Gastrointestinal: Negative.   Genitourinary: Negative.   Musculoskeletal: Negative.   Skin: Negative.   Neurological: Negative.   Endo/Heme/Allergies: Negative.   Psychiatric/Behavioral: Positive for depression (Stable). Negative for hallucinations, memory loss, substance abuse and suicidal ideas. The patient has insomnia (Stable). The patient is not nervous/anxious.     Blood pressure 110/76, pulse (!) 104, temperature 98.6 F (37 C), resp. rate 16, height 5\' 2"  (1.575 m), weight 92.5 kg (204 lb).Body mass index is 37.31 kg/m.  See Md's SRA   Have you used any form of tobacco in the last 30 days? (Cigarettes, Smokeless Tobacco, Cigars, and/or Pipes): No  Has this patient used any form of tobacco in the last 30 days? (Cigarettes, Smokeless Tobacco, Cigars, and/or Pipes): No  Blood Alcohol level:  No results found for: [facility]  Metabolic Disorder Labs:  No results found for: HGBA1C, MPG No results found for: PROLACTIN No results found  for: CHOL, TRIG, HDL, CHOLHDL, VLDL, LDLCALC  See Psychiatric Specialty Exam and Suicide Risk Assessment completed by Attending Physician prior to discharge.  Discharge destination:  Home  Is patient on multiple antipsychotic therapies at discharge:  No   Has Patient had three or more failed trials of antipsychotic monotherapy by history:  No  Recommended Plan for Multiple Antipsychotic Therapies: NA  Allergies as of 08/24/2016      Reactions   Strawberry Extract Swelling   Kiwi Extract    Red Dye Rash      Medication List    STOP taking these medications   prenatal  multivitamin Tabs tablet     TAKE these medications     Indication  traZODone 50 MG tablet Commonly known as:  DESYREL Take 1 tablet (50 mg) at bedtime: For sleep  Indication:  Trouble Sleeping   venlafaxine XR 75 MG 24 hr capsule Commonly known as:  EFFEXOR-XR Take 1 capsule (75 mg total) by mouth daily with breakfast. For depression  Indication:  Major Depressive Disorder      Follow-up Information    [facility] Follow up on 08/25/2016.   Why:  Hospital follow-up appointment at 12:15pm. Please bring a photo ID, your social security card, insurance card, and proof of income if you have it. Thank you. Contact information: [address] [place] [address] [phone]          Follow-up recommendations:  Activity:  As tolerated Diet: As recommended by your primary care doctor. Keep all scheduled follow-up appointments as recommended.  Comments: Patient is instructed prior to discharge to: Take all medications as prescribed by his/her mental healthcare provider. Report any adverse effects and or reactions from the medicines to his/her outpatient provider promptly. Patient has been instructed & cautioned: To not engage in alcohol and or illegal drug use while on prescription medicines. In the event of worsening symptoms, patient is instructed to call the crisis hotline, 911 and or go to the nearest ED for appropriate evaluation and treatment of symptoms. To follow-up with his/her primary care provider for your other medical issues, concerns and or health care needs.   Signed: Sanjuana Kava, NP, PMHNP, FNP-BC 08/25/2016, 1:43 PM   Patient seen, Suicide Assessment Completed.  Disposition Plan Reviewed

## 2016-08-25 DIAGNOSIS — F411 Generalized anxiety disorder: Secondary | ICD-10-CM | POA: Diagnosis not present

## 2016-08-29 DIAGNOSIS — F411 Generalized anxiety disorder: Secondary | ICD-10-CM | POA: Diagnosis not present

## 2016-08-31 DIAGNOSIS — F332 Major depressive disorder, recurrent severe without psychotic features: Secondary | ICD-10-CM | POA: Diagnosis not present

## 2016-09-04 DIAGNOSIS — H903 Sensorineural hearing loss, bilateral: Secondary | ICD-10-CM | POA: Diagnosis not present

## 2016-09-12 DIAGNOSIS — H35413 Lattice degeneration of retina, bilateral: Secondary | ICD-10-CM | POA: Diagnosis not present

## 2016-09-12 DIAGNOSIS — Q898 Other specified congenital malformations: Secondary | ICD-10-CM | POA: Diagnosis not present

## 2016-09-12 DIAGNOSIS — Q159 Congenital malformation of eye, unspecified: Secondary | ICD-10-CM | POA: Diagnosis not present

## 2016-09-12 DIAGNOSIS — H53453 Other localized visual field defect, bilateral: Secondary | ICD-10-CM | POA: Diagnosis not present

## 2016-09-14 DIAGNOSIS — F411 Generalized anxiety disorder: Secondary | ICD-10-CM | POA: Diagnosis not present

## 2016-09-21 DIAGNOSIS — F411 Generalized anxiety disorder: Secondary | ICD-10-CM | POA: Diagnosis not present

## 2016-09-22 DIAGNOSIS — F331 Major depressive disorder, recurrent, moderate: Secondary | ICD-10-CM | POA: Diagnosis not present

## 2016-09-22 DIAGNOSIS — F411 Generalized anxiety disorder: Secondary | ICD-10-CM | POA: Diagnosis not present

## 2016-09-22 DIAGNOSIS — F4310 Post-traumatic stress disorder, unspecified: Secondary | ICD-10-CM | POA: Diagnosis not present

## 2016-09-22 MED FILL — VENLAFAXINE HCL ER 150 MG C: 150 | 30 days supply | Qty: 30 | Fill #0

## 2016-09-22 MED FILL — HYDROXYZINE PAM 25 MG CAP: 25 | 30 days supply | Qty: 90 | Fill #0

## 2016-09-22 MED FILL — traZODone HCL 100 MG TABS: 100 | 30 days supply | Qty: 30 | Fill #0

## 2016-09-27 MED FILL — clonazePAM 0.5 MG TABS: 0.5 | 30 days supply | Qty: 60 | Fill #0

## 2016-09-28 DIAGNOSIS — F411 Generalized anxiety disorder: Secondary | ICD-10-CM | POA: Diagnosis not present

## 2016-10-04 DIAGNOSIS — F4310 Post-traumatic stress disorder, unspecified: Secondary | ICD-10-CM | POA: Diagnosis not present

## 2016-10-05 DIAGNOSIS — F411 Generalized anxiety disorder: Secondary | ICD-10-CM | POA: Diagnosis not present

## 2016-10-10 DIAGNOSIS — F411 Generalized anxiety disorder: Secondary | ICD-10-CM | POA: Diagnosis not present

## 2016-10-12 DIAGNOSIS — F411 Generalized anxiety disorder: Secondary | ICD-10-CM | POA: Diagnosis not present

## 2016-10-12 MED FILL — ARIPiprazole 5 MG TABS: 5 | 33 days supply | Qty: 30 | Fill #0

## 2016-10-12 MED FILL — VENLAFAXINE HCL ER 75 MG CA: 75 | 30 days supply | Qty: 30 | Fill #0

## 2016-10-19 DIAGNOSIS — F411 Generalized anxiety disorder: Secondary | ICD-10-CM | POA: Diagnosis not present

## 2016-10-24 DIAGNOSIS — F4310 Post-traumatic stress disorder, unspecified: Secondary | ICD-10-CM | POA: Diagnosis not present

## 2016-10-30 DIAGNOSIS — Z113 Encounter for screening for infections with a predominantly sexual mode of transmission: Secondary | ICD-10-CM | POA: Diagnosis not present

## 2016-10-30 DIAGNOSIS — Z862 Personal history of diseases of the blood and blood-forming organs and certain disorders involving the immune mechanism: Secondary | ICD-10-CM | POA: Diagnosis not present

## 2016-10-30 DIAGNOSIS — Z6834 Body mass index (BMI) 34.0-34.9, adult: Secondary | ICD-10-CM | POA: Diagnosis not present

## 2016-11-02 DIAGNOSIS — F411 Generalized anxiety disorder: Secondary | ICD-10-CM | POA: Diagnosis not present

## 2016-11-07 DIAGNOSIS — F332 Major depressive disorder, recurrent severe without psychotic features: Secondary | ICD-10-CM | POA: Diagnosis not present

## 2016-11-08 MED FILL — VENLAFAXINE HCL ER 75 MG CA: 75 | 30 days supply | Qty: 30 | Fill #1

## 2016-11-08 MED FILL — traZODone HCL 100 MG TABS: 100 | 30 days supply | Qty: 30 | Fill #0

## 2016-11-08 MED FILL — HYDROXYZINE PAM 25 MG CAP: 25 | 30 days supply | Qty: 90 | Fill #0

## 2016-11-08 MED FILL — ARIPiprazole 5 MG TABS: 5 | 30 days supply | Qty: 30 | Fill #1

## 2016-11-09 DIAGNOSIS — F411 Generalized anxiety disorder: Secondary | ICD-10-CM | POA: Diagnosis not present

## 2016-11-15 DIAGNOSIS — H53432 Sector or arcuate defects, left eye: Secondary | ICD-10-CM | POA: Diagnosis not present

## 2016-11-15 DIAGNOSIS — Q898 Other specified congenital malformations: Secondary | ICD-10-CM | POA: Diagnosis not present

## 2016-11-15 DIAGNOSIS — H47292 Other optic atrophy, left eye: Secondary | ICD-10-CM | POA: Diagnosis not present

## 2016-11-15 DIAGNOSIS — H35413 Lattice degeneration of retina, bilateral: Secondary | ICD-10-CM | POA: Diagnosis not present

## 2016-11-20 DIAGNOSIS — R231 Pallor: Secondary | ICD-10-CM | POA: Diagnosis not present

## 2016-11-20 DIAGNOSIS — H53432 Sector or arcuate defects, left eye: Secondary | ICD-10-CM | POA: Diagnosis not present

## 2016-11-23 DIAGNOSIS — F411 Generalized anxiety disorder: Secondary | ICD-10-CM | POA: Diagnosis not present

## 2016-11-27 DIAGNOSIS — H47292 Other optic atrophy, left eye: Secondary | ICD-10-CM | POA: Diagnosis not present

## 2016-11-27 DIAGNOSIS — H53432 Sector or arcuate defects, left eye: Secondary | ICD-10-CM | POA: Diagnosis not present

## 2016-11-27 DIAGNOSIS — H5213 Myopia, bilateral: Secondary | ICD-10-CM | POA: Diagnosis not present

## 2016-11-27 DIAGNOSIS — H53452 Other localized visual field defect, left eye: Secondary | ICD-10-CM | POA: Diagnosis not present

## 2016-11-27 DIAGNOSIS — Q898 Other specified congenital malformations: Secondary | ICD-10-CM | POA: Diagnosis not present

## 2016-11-27 DIAGNOSIS — H35413 Lattice degeneration of retina, bilateral: Secondary | ICD-10-CM | POA: Diagnosis not present

## 2016-12-04 DIAGNOSIS — Z6834 Body mass index (BMI) 34.0-34.9, adult: Secondary | ICD-10-CM | POA: Diagnosis not present

## 2016-12-04 DIAGNOSIS — N926 Irregular menstruation, unspecified: Secondary | ICD-10-CM | POA: Diagnosis not present

## 2016-12-05 DIAGNOSIS — I77810 Thoracic aortic ectasia: Secondary | ICD-10-CM | POA: Diagnosis not present

## 2016-12-05 DIAGNOSIS — R0789 Other chest pain: Secondary | ICD-10-CM | POA: Diagnosis not present

## 2016-12-05 DIAGNOSIS — R42 Dizziness and giddiness: Secondary | ICD-10-CM | POA: Diagnosis not present

## 2016-12-12 DIAGNOSIS — F4310 Post-traumatic stress disorder, unspecified: Secondary | ICD-10-CM | POA: Diagnosis not present

## 2016-12-14 DIAGNOSIS — F332 Major depressive disorder, recurrent severe without psychotic features: Secondary | ICD-10-CM | POA: Diagnosis not present

## 2016-12-19 DIAGNOSIS — S71109A Unspecified open wound, unspecified thigh, initial encounter: Secondary | ICD-10-CM | POA: Diagnosis not present

## 2016-12-19 DIAGNOSIS — H109 Unspecified conjunctivitis: Secondary | ICD-10-CM | POA: Diagnosis not present

## 2016-12-19 DIAGNOSIS — Z6834 Body mass index (BMI) 34.0-34.9, adult: Secondary | ICD-10-CM | POA: Diagnosis not present

## 2016-12-19 DIAGNOSIS — J029 Acute pharyngitis, unspecified: Secondary | ICD-10-CM | POA: Diagnosis not present

## 2017-01-05 DIAGNOSIS — N3000 Acute cystitis without hematuria: Secondary | ICD-10-CM | POA: Diagnosis not present

## 2017-01-05 DIAGNOSIS — Z6834 Body mass index (BMI) 34.0-34.9, adult: Secondary | ICD-10-CM | POA: Diagnosis not present

## 2017-01-18 DIAGNOSIS — J40 Bronchitis, not specified as acute or chronic: Secondary | ICD-10-CM | POA: Diagnosis not present

## 2017-01-18 DIAGNOSIS — Z6834 Body mass index (BMI) 34.0-34.9, adult: Secondary | ICD-10-CM | POA: Diagnosis not present

## 2017-02-09 DIAGNOSIS — H6693 Otitis media, unspecified, bilateral: Secondary | ICD-10-CM | POA: Diagnosis not present

## 2017-02-09 DIAGNOSIS — R590 Localized enlarged lymph nodes: Secondary | ICD-10-CM | POA: Diagnosis not present

## 2017-02-09 DIAGNOSIS — Z6834 Body mass index (BMI) 34.0-34.9, adult: Secondary | ICD-10-CM | POA: Diagnosis not present

## 2017-02-26 DIAGNOSIS — H903 Sensorineural hearing loss, bilateral: Secondary | ICD-10-CM | POA: Diagnosis not present

## 2017-02-26 DIAGNOSIS — R590 Localized enlarged lymph nodes: Secondary | ICD-10-CM | POA: Diagnosis not present

## 2017-03-15 DIAGNOSIS — R590 Localized enlarged lymph nodes: Secondary | ICD-10-CM | POA: Diagnosis not present

## 2017-03-16 DIAGNOSIS — R42 Dizziness and giddiness: Secondary | ICD-10-CM | POA: Diagnosis not present

## 2017-03-16 DIAGNOSIS — R55 Syncope and collapse: Secondary | ICD-10-CM | POA: Diagnosis not present

## 2017-03-16 DIAGNOSIS — Z008 Encounter for other general examination: Secondary | ICD-10-CM | POA: Diagnosis not present

## 2017-03-16 DIAGNOSIS — R000 Tachycardia, unspecified: Secondary | ICD-10-CM | POA: Diagnosis not present

## 2017-03-20 DIAGNOSIS — H35413 Lattice degeneration of retina, bilateral: Secondary | ICD-10-CM | POA: Diagnosis not present

## 2017-03-20 DIAGNOSIS — H47292 Other optic atrophy, left eye: Secondary | ICD-10-CM | POA: Diagnosis not present

## 2017-03-20 DIAGNOSIS — Q898 Other specified congenital malformations: Secondary | ICD-10-CM | POA: Diagnosis not present

## 2017-05-03 DIAGNOSIS — J329 Chronic sinusitis, unspecified: Secondary | ICD-10-CM | POA: Diagnosis not present

## 2017-05-03 DIAGNOSIS — Z6836 Body mass index (BMI) 36.0-36.9, adult: Secondary | ICD-10-CM | POA: Diagnosis not present

## 2017-06-01 DIAGNOSIS — Z6836 Body mass index (BMI) 36.0-36.9, adult: Secondary | ICD-10-CM | POA: Diagnosis not present

## 2017-06-01 DIAGNOSIS — M940 Chondrocostal junction syndrome [Tietze]: Secondary | ICD-10-CM | POA: Diagnosis not present

## 2017-06-01 DIAGNOSIS — Z20828 Contact with and (suspected) exposure to other viral communicable diseases: Secondary | ICD-10-CM | POA: Diagnosis not present

## 2017-06-22 DIAGNOSIS — Z23 Encounter for immunization: Secondary | ICD-10-CM | POA: Diagnosis not present

## 2017-06-22 DIAGNOSIS — Z6837 Body mass index (BMI) 37.0-37.9, adult: Secondary | ICD-10-CM | POA: Diagnosis not present

## 2017-06-22 DIAGNOSIS — Z91030 Bee allergy status: Secondary | ICD-10-CM | POA: Diagnosis not present

## 2017-07-09 DIAGNOSIS — R5383 Other fatigue: Secondary | ICD-10-CM | POA: Diagnosis not present

## 2017-07-09 DIAGNOSIS — R631 Polydipsia: Secondary | ICD-10-CM | POA: Diagnosis not present

## 2017-07-09 DIAGNOSIS — Z6837 Body mass index (BMI) 37.0-37.9, adult: Secondary | ICD-10-CM | POA: Diagnosis not present

## 2017-07-09 DIAGNOSIS — E669 Obesity, unspecified: Secondary | ICD-10-CM | POA: Diagnosis not present

## 2017-07-11 MED FILL — NORETHIND-ETH ESTRAD 1-0.02: 1-20 | 28 days supply | Qty: 21 | Fill #0

## 2017-07-23 DIAGNOSIS — Z01419 Encounter for gynecological examination (general) (routine) without abnormal findings: Secondary | ICD-10-CM | POA: Diagnosis not present

## 2017-07-23 DIAGNOSIS — Z1331 Encounter for screening for depression: Secondary | ICD-10-CM | POA: Diagnosis not present

## 2017-07-23 DIAGNOSIS — Z6838 Body mass index (BMI) 38.0-38.9, adult: Secondary | ICD-10-CM | POA: Diagnosis not present

## 2017-07-24 DIAGNOSIS — Z01419 Encounter for gynecological examination (general) (routine) without abnormal findings: Secondary | ICD-10-CM | POA: Diagnosis not present

## 2017-08-08 MED FILL — NORETHIND-ETH ESTRAD 1-0.02: 1-20 | 84 days supply | Qty: 63 | Fill #1

## 2017-08-14 DIAGNOSIS — Z8041 Family history of malignant neoplasm of ovary: Secondary | ICD-10-CM | POA: Diagnosis not present

## 2017-08-14 DIAGNOSIS — Z803 Family history of malignant neoplasm of breast: Secondary | ICD-10-CM | POA: Diagnosis not present

## 2017-10-11 DIAGNOSIS — Z6839 Body mass index (BMI) 39.0-39.9, adult: Secondary | ICD-10-CM | POA: Diagnosis not present

## 2017-11-20 DIAGNOSIS — R55 Syncope and collapse: Secondary | ICD-10-CM | POA: Diagnosis not present

## 2017-11-30 DIAGNOSIS — J329 Chronic sinusitis, unspecified: Secondary | ICD-10-CM | POA: Diagnosis not present

## 2017-12-03 DIAGNOSIS — S93602A Unspecified sprain of left foot, initial encounter: Secondary | ICD-10-CM | POA: Diagnosis not present

## 2017-12-03 DIAGNOSIS — S9032XA Contusion of left foot, initial encounter: Secondary | ICD-10-CM | POA: Diagnosis not present

## 2017-12-20 ENCOUNTER — Other Ambulatory Visit: Payer: Self-pay

## 2017-12-20 ENCOUNTER — Encounter (HOSPITAL_COMMUNITY): Payer: Self-pay | Admitting: Emergency Medicine

## 2017-12-20 ENCOUNTER — Emergency Department (HOSPITAL_COMMUNITY)
Admission: EM | Admit: 2017-12-20 | Discharge: 2017-12-21 | Disposition: A | Discharge status: Home / Self Care | Payer: 59 | Attending: Emergency Medicine | Admitting: Emergency Medicine

## 2017-12-20 DIAGNOSIS — R55 Syncope and collapse: Secondary | ICD-10-CM | POA: Insufficient documentation

## 2017-12-20 DIAGNOSIS — R0902 Hypoxemia: Secondary | ICD-10-CM | POA: Diagnosis not present

## 2017-12-20 DIAGNOSIS — R42 Dizziness and giddiness: Secondary | ICD-10-CM | POA: Diagnosis not present

## 2017-12-20 DIAGNOSIS — R000 Tachycardia, unspecified: Secondary | ICD-10-CM | POA: Diagnosis not present

## 2017-12-20 DIAGNOSIS — R0602 Shortness of breath: Secondary | ICD-10-CM | POA: Diagnosis not present

## 2017-12-20 LAB — I-STAT TROPONIN, ED: Troponin i, poc: 0 ng/mL (ref 0.00–0.08)

## 2017-12-20 LAB — BASIC METABOLIC PANEL
ANION GAP: 9 (ref 5–15)
CALCIUM: 9 mg/dL (ref 8.9–10.3)
CO2: 25 mmol/L (ref 22–32)
CREATININE: 0.83 mg/dL (ref 0.44–1.00)
Chloride: 105 mmol/L (ref 98–111)
GFR calc Af Amer: >60 mL/min (ref >60)
Glucose, Bld: 101 mg/dL — ABNORMAL HIGH (ref 70–99)
Potassium: 3.6 mmol/L (ref 3.5–5.1)
Sodium: 139 mmol/L (ref 135–145)

## 2017-12-20 LAB — CBC
HEMATOCRIT: 39 % (ref 36.0–46.0)
HEMOGLOBIN: 12.2 g/dL (ref 12.0–15.0)
MCH: 28.8 pg (ref 26.0–34.0)
MCHC: 31.3 g/dL (ref 30.0–36.0)
MCV: 92.2 fL (ref 80.0–100.0)
Platelets: 386 10*3/uL (ref 150–400)
RBC: 4.23 MIL/uL (ref 3.87–5.11)
RDW: 13.6 % (ref 11.5–15.5)
WBC: 14 10*3/uL — ABNORMAL HIGH (ref 4.0–10.5)
nRBC: 0 % (ref 0.0–0.2)

## 2017-12-20 LAB — I-STAT BETA HCG BLOOD, ED (MC, WL, AP ONLY): I-stat hCG, quantitative: <5 m[IU]/mL (ref <5)

## 2017-12-20 NOTE — ED Triage Notes (Signed)
Pt brought in by [facility] from [place] [facility] [employer]. Pt works at [employer] when she had a witnessed syncopal episode. Pt has a history of neurocardiogenic syncope. Pt vomited and urinated at the time of her syncopal episode. EMS gave her 4 mg Zofran.  HR 108 Sinus Tach

## 2017-12-20 NOTE — ED Provider Notes (Addendum)
[facility] EMERGENCY DEPARTMENT Provider Note   CSN: [id number] Arrival date & time: 12/20/17  2149     History   Chief Complaint Chief Complaint  Patient presents with  . Loss of Consciousness    HPI Kaitlyn Moreno is a 22 y.o. female with a past medical history of Stickler syndrome connective tissue disorder, and vasodepressive dysautonomic syncope followed at [facility] electrophysiology and cardiology who presents the emergency department with syncope.  Patient has a long-standing history of syncope.  She is currently working at [employer] of [employer].  She states that tonight she had onset of presyncopal symptoms however what was different about tonight was that when she passed out she vomited on herself and urinated.  She has never done that before.  Patient states that she stumbled toward her manager and then passed out again and vomited.  She had one more episode of syncope when EMS stood her to try to get onto the stretcher.  She denies any chest pain.  She was awake and alert after falling and had no seizure-like symptoms.  She states that she was in her normal state of health today otherwise.  She did not drink as much fluid as normal she is instructed to take in about 3 L daily along with salt tablets and monitor her blood pressure twice a day per EMR review of [facility] notes.  The patient is also on pyridostigmine to elevate her blood pressure slightly.   She denies cp, sob, headache   HPI  Past Medical History:  Diagnosis Date  . Anxiety   . Arthritis    "right hip; hands" (06/29/2016)  . Headache    "probably weekly" (06/29/2016)  . Migraine    "probably 2 times/month" (06/29/2016)  . Murmur ~ 2007  . [name]   . Pneumonia    "multiple times" (06/29/2016)  . PONV (postoperative nausea and vomiting)   . Pyelonephritis 06/29/2016  . 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 MDD (major depressive disorder) 08/21/2016  . MDD (major depressive disorder), single episode, severe (HCC) 08/20/2016  . Acute pyelonephritis 06/30/2016  . Pyelonephritis 06/29/2016  . Streptococcal sepsis, unspecified (HCC) 06/29/2016  . Postpartum state 06/29/2016  . Abdominal pain 06/29/2016  . Sepsis [facility])     Past Surgical History:  Procedure Laterality Date  . CLEFT PALATE REPAIR  1999; ~ 2005  . INTRAUTERINE DEVICE INSERTION  06/2016  . TYMPANOSTOMY TUBE PLACEMENT Bilateral X 7     OB History   None      Home Medications    Prior to Admission medications   Medication Sig Start Date End Date Taking? Authorizing Provider  traZODone (DESYREL) 50 MG tablet Take 1 tablet (50 mg) at bedtime: For sleep 08/24/16   Armandina Stammer I, NP  venlafaxine XR (EFFEXOR-XR) 75 MG 24 hr capsule Take 1 capsule (75 mg total) by mouth daily with breakfast. For depression 08/25/16   Sanjuana Kava, NP    Family History Family History  Problem Relation Age of Onset  . Diabetes Mother   . Stickler syndrome Father     Social History Social History   Tobacco Use  . Smoking status: Never Smoker  . Smokeless tobacco: Never Used  Substance Use Topics  . Alcohol use: No  . Drug use: No     Allergies  Strawberry extract; Kiwi extract; Percocet [oxycodone-acetaminophen]; and Red dye   Review of Systems Review of Systems  Ten systems reviewed and are negative for acute change, except as noted in the HPI.   Physical Exam Updated Vital Signs BP (!) 120/107 (BP Location: Right Arm)   Pulse 90   Temp 99 F (37.2 C) (Oral)   Resp 16   Ht 5\' 2"  (1.575 m)   Wt 99.3 kg   SpO2 99%   BMI 40.06 kg/m   Physical Exam  Constitutional: She is oriented to person, place, and time. She appears well-developed and well-nourished. No distress.  HENT:  Head: Normocephalic and atraumatic.  Eyes: Conjunctivae are normal. No scleral icterus.  Neck: Normal range of  motion.  Cardiovascular: Normal rate, regular rhythm and normal heart sounds. Exam reveals no gallop and no friction rub.  No murmur heard. Pulmonary/Chest: Effort normal and breath sounds normal. No respiratory distress.  Abdominal: Soft. Bowel sounds are normal. She exhibits no distension and no mass. There is no tenderness. There is no guarding.  Neurological: She is alert and oriented to person, place, and time.  Speech is clear and goal oriented, follows commands Major Cranial nerves without deficit, no facial droop Normal strength in upper and lower extremities bilaterally including dorsiflexion and plantar flexion, strong and equal grip strength Sensation normal to light and sharp touch Moves extremities without ataxia, coordination intact Normal finger to nose and rapid alternating movements Neg romberg, no pronator drift Normal gait Normal heel-shin and balance   Skin: Skin is warm and dry. She is not diaphoretic.  Psychiatric: Her behavior is normal.  Nursing note and vitals reviewed.    ED Treatments / Results  Labs (all labs ordered are listed, but only abnormal results are displayed) Labs Reviewed  BASIC METABOLIC PANEL  CBC  I-STAT BETA HCG BLOOD, ED ([facility], [facility], [facility] ONLY)  I-STAT TROPONIN, ED  CBG MONITORING, ED    EKG EKG Interpretation  Date/Time:  Thursday December 20 2017 22:04:22 EDT Ventricular Rate:  90 PR Interval:    QRS Duration: 80 QT Interval:  350 QTC Calculation: 429 R Axis:   62 Text Interpretation:  Sinus rhythm no wpw, prolonged qt or brugada Otherwise no significant change Confirmed by Melene Plan [phone]) on 12/20/2017 10:17:39 PM   Radiology No results found.  Procedures Procedures (including critical care time)  Medications Ordered in ED Medications - No data to display   Initial Impression / Assessment and Plan / ED Course  I have reviewed the triage vital signs and the nursing notes.  Pertinent labs & imaging results that were  available during my care of the patient were reviewed by me and considered in my medical decision making (see chart for details).    22 year old female who presents with syncope.The differential for syncope is extensive and includes, but is not limited to: arrythmia (Vtach, SVT, SSS, sinus arrest, AV block, bradycardia) aortic stenosis, AMI, HOCM, PE, atrial myxoma, pulmonary hypertension, orthostatic hypotension, (hypovolemia, drug effect, GB syndrome, micturition, cough, swall) carotid sinus sensitivity, Seizure, TIA/CVA, hypoglycemia,  Vertigo. 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  Her symptoms were abnormal for her today.  She is had an extensive work-up with [facility] cardiology and is followed at the dysautonomia clinic.  She does not appear to have positive orthostatic vital signs and her symptoms are resolved.  She is ambulatory here in the emergency department.  EKG shows no evidence of Brugada, WPW or heart  block.  She is PERC negative.  Patient appears appropriate for discharge at this time.  Final Clinical Impressions(s) / ED Diagnoses   Final diagnoses:  Syncope and collapse    ED Discharge Orders    None       Arthor Captain, PA-C 12/21/17 0111    Melene Plan, DO 12/24/17 0708    Arthor Captain, PA-C 01/05/18 2019    Melene Plan, DO 01/06/18 0700

## 2017-12-21 MED ORDER — ONDANSETRON HCL 4 MG PO TABS: 4.0000 mg | ORAL_TABLET | Freq: Three times a day (TID) | ORAL | 0 refills | Status: DC | PRN

## 2017-12-21 NOTE — Discharge Instructions (Addendum)
et help right away if: You have a severe headache. You have unusual pain in your chest, abdomen, or back. You are bleeding from your mouth or rectum, or you have black or tarry stool. You have a very fast or irregular heartbeat (palpitations). You have pain with breathing. You faint once or repeatedly. You have a seizure. You are confused. You have trouble walking. You have severe weakness. You have vision problems.

## 2017-12-21 NOTE — ED Notes (Signed)
Pt changing into paper scrubs and calling ride

## 2018-01-02 DIAGNOSIS — H903 Sensorineural hearing loss, bilateral: Secondary | ICD-10-CM | POA: Diagnosis not present

## 2018-02-21 DIAGNOSIS — R112 Nausea with vomiting, unspecified: Secondary | ICD-10-CM | POA: Diagnosis not present

## 2018-02-21 DIAGNOSIS — R55 Syncope and collapse: Secondary | ICD-10-CM | POA: Diagnosis not present

## 2018-02-21 DIAGNOSIS — Z6838 Body mass index (BMI) 38.0-38.9, adult: Secondary | ICD-10-CM | POA: Diagnosis not present

## 2018-02-27 MED FILL — PYRIDOSTIGMINE BR 60 MG TAB: 60 | 30 days supply | Qty: 30 | Fill #0

## 2018-03-07 DIAGNOSIS — B349 Viral infection, unspecified: Secondary | ICD-10-CM | POA: Diagnosis not present

## 2018-03-11 ENCOUNTER — Encounter (HOSPITAL_COMMUNITY): Payer: Self-pay

## 2018-03-11 ENCOUNTER — Other Ambulatory Visit: Payer: Self-pay

## 2018-03-11 ENCOUNTER — Emergency Department (HOSPITAL_COMMUNITY): Payer: 59

## 2018-03-11 ENCOUNTER — Emergency Department (HOSPITAL_COMMUNITY)
Admission: EM | Admit: 2018-03-11 | Discharge: 2018-03-11 | Disposition: A | Discharge status: Home / Self Care | Payer: 59 | Attending: Emergency Medicine | Admitting: Emergency Medicine

## 2018-03-11 DIAGNOSIS — R509 Fever, unspecified: Secondary | ICD-10-CM | POA: Diagnosis not present

## 2018-03-11 DIAGNOSIS — J111 Influenza due to unidentified influenza virus with other respiratory manifestations: Secondary | ICD-10-CM | POA: Diagnosis not present

## 2018-03-11 DIAGNOSIS — Z79899 Other long term (current) drug therapy: Secondary | ICD-10-CM | POA: Diagnosis not present

## 2018-03-11 DIAGNOSIS — R05 Cough: Secondary | ICD-10-CM

## 2018-03-11 DIAGNOSIS — R55 Syncope and collapse: Secondary | ICD-10-CM | POA: Diagnosis not present

## 2018-03-11 DIAGNOSIS — R059 Cough, unspecified: Secondary | ICD-10-CM

## 2018-03-11 DIAGNOSIS — R69 Illness, unspecified: Secondary | ICD-10-CM

## 2018-03-11 LAB — CBC
HEMATOCRIT: 39.6 % (ref 36.0–46.0)
HEMOGLOBIN: 12.5 g/dL (ref 12.0–15.0)
MCH: 29.2 pg (ref 26.0–34.0)
MCHC: 31.6 g/dL (ref 30.0–36.0)
MCV: 92.5 fL (ref 80.0–100.0)
NRBC: 0 % (ref 0.0–0.2)
Platelets: 401 10*3/uL — ABNORMAL HIGH (ref 150–400)
RBC: 4.28 MIL/uL (ref 3.87–5.11)
RDW: 13.3 % (ref 11.5–15.5)
WBC: 13.4 10*3/uL — AB (ref 4.0–10.5)

## 2018-03-11 LAB — BASIC METABOLIC PANEL
ANION GAP: 11 (ref 5–15)
BUN: 5 mg/dL — ABNORMAL LOW (ref 6–20)
CO2: 24 mmol/L (ref 22–32)
Calcium: 8.8 mg/dL — ABNORMAL LOW (ref 8.9–10.3)
Chloride: 103 mmol/L (ref 98–111)
Creatinine, Ser: 0.72 mg/dL (ref 0.44–1.00)
GFR calc non Af Amer: >60 mL/min (ref >60)
Glucose, Bld: 104 mg/dL — ABNORMAL HIGH (ref 70–99)
POTASSIUM: 3.9 mmol/L (ref 3.5–5.1)
SODIUM: 138 mmol/L (ref 135–145)

## 2018-03-11 LAB — I-STAT BETA HCG BLOOD, ED (MC, WL, AP ONLY): I-stat hCG, quantitative: <5 m[IU]/mL (ref <5)

## 2018-03-11 MED ORDER — ONDANSETRON HCL 4 MG/2ML IJ SOLN
4.0000 mg | Freq: Once | INTRAMUSCULAR | Status: AC
  Administered 2018-03-11: 4 mg via INTRAVENOUS
  Filled 2018-03-11: qty 2

## 2018-03-11 MED ORDER — LIDOCAINE VISCOUS HCL 2 % MT SOLN
15.0000 mL | Freq: Once | OROMUCOSAL | Status: AC
  Administered 2018-03-11: 15 mL via OROMUCOSAL
  Filled 2018-03-11: qty 15

## 2018-03-11 MED ORDER — SODIUM CHLORIDE 0.9 % IV BOLUS
1000.0000 mL | Freq: Once | INTRAVENOUS | Status: AC
  Administered 2018-03-11: 1000 mL via INTRAVENOUS

## 2018-03-11 NOTE — ED Triage Notes (Addendum)
Pt was at the registration desk checking in for flu sx when pt had syncopal episode where she fell from standing onto her back. Pt quickly regained consciousness and assessed by this RN. Pt has NCS and passes out daily if she doesn't eat or drink appropriately and is follow by [facility] syncope clinic. Pt unable to keep anything down with cough/congestion x 4 days. Pt had positive flu reading on 03/07/18 VSS. No neuro deficits.

## 2018-03-11 NOTE — ED Provider Notes (Signed)
[facility] EMERGENCY DEPARTMENT Provider Note   CSN: [id number] Arrival date & time: 03/11/18  1800     History   Chief Complaint Chief Complaint  Patient presents with  . Influenza  . Loss of Consciousness    HPI Kaitlyn Moreno is a 22 y.o. female.  The history is provided by the patient and medical records. No language interpreter was used.  Influenza  Presenting symptoms: cough, fever, myalgias, nausea and vomiting   Presenting symptoms: no shortness of breath   Associated symptoms: chills and nasal congestion   Loss of Consciousness   Associated symptoms include congestion, fever, nausea and vomiting. Pertinent negatives include abdominal pain and chest pain.   Kaitlyn Moreno is a 22 y.o. 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  Symptoms have been ongoing for about 4 days now.  She did test positive for the flu at onset of symptoms on 12/26.  She has been taking Tamiflu and Zofran.  She states the Zofran is not working and she is continuing to vomit several times a day.  She does not feel like she is keeping any food or fluids down.  While in the emergency department at the registration desk, she had a witnessed syncopal episode.  Patient states this is a not uncommon for her given her history of vasodepressor syncope.  This episode is consistent with her previous syncopal episodes.  She feels as if she is dehydrated which makes her much more likely to pass out.   Past Medical History:  Diagnosis Date  . Anxiety   . Arthritis    "right hip; hands" (06/29/2016)  . Headache    "probably weekly" (06/29/2016)  . Migraine    "probably 2 times/month" (06/29/2016)  . Murmur ~ 2007  . [name]   . Pneumonia    "multiple times" (06/29/2016)  . PONV (postoperative nausea and vomiting)   .  Pyelonephritis 06/29/2016  . Stickler syndrome    "body cannot produce enough collegen; affects all the body"    Patient Active Problem List   Diagnosis Date Noted  . MDD (major depressive disorder) 08/21/2016  . MDD (major depressive disorder), single episode, severe (HCC) 08/20/2016  . Acute pyelonephritis 06/30/2016  . Pyelonephritis 06/29/2016  . Streptococcal sepsis, unspecified (HCC) 06/29/2016  . Postpartum state 06/29/2016  . Abdominal pain 06/29/2016  . Sepsis [facility])     Past Surgical History:  Procedure Laterality Date  . CLEFT PALATE REPAIR  1999; ~ 2005  . INTRAUTERINE DEVICE INSERTION  06/2016  . TYMPANOSTOMY TUBE PLACEMENT Bilateral X 7     OB History   No obstetric history on file.      Home Medications    Prior to Admission medications   Medication Sig Start Date End Date Taking? Authorizing Provider  ondansetron (ZOFRAN) 4 MG tablet Take 1 tablet (4 mg total) by mouth every 8 (eight) hours as needed for nausea or vomiting. 12/21/17   Arthor Captain, PA-C  traZODone (DESYREL) 50 MG tablet Take 1 tablet (50 mg) at bedtime: For sleep 08/24/16   Armandina Stammer I, NP  venlafaxine XR (EFFEXOR-XR) 75 MG 24 hr capsule Take 1 capsule (75 mg total) by mouth daily with breakfast. For depression 08/25/16   Sanjuana Kava, NP    Family History Family History  Problem Relation Age of Onset  . Diabetes  Mother   . Stickler syndrome Father     Social History Social History   Tobacco Use  . Smoking status: Never Smoker  . Smokeless tobacco: Never Used  Substance Use Topics  . Alcohol use: No  . Drug use: No     Allergies   Strawberry extract; Kiwi extract; Percocet [oxycodone-acetaminophen]; and Red dye   Review of Systems Review of Systems  Constitutional: Positive for chills and fever.  HENT: Positive for congestion.   Respiratory: Positive for cough. Negative for shortness of breath.   Cardiovascular: Positive for syncope. Negative for chest pain.    Gastrointestinal: Positive for nausea and vomiting. Negative for abdominal pain.  Musculoskeletal: Positive for myalgias.  All other systems reviewed and are negative.    Physical Exam Updated Vital Signs BP 107/67   Pulse 81   Temp 99 F (37.2 C) (Oral)   Resp 20   Ht 5\' 2"  (1.575 m)   Wt 99.8 kg   LMP  (LMP Unknown) Comment: had psalpingectomy  SpO2 100%   BMI 40.24 kg/m   Physical Exam Vitals signs and nursing note reviewed.  Constitutional:      General: She is not in acute distress.    Appearance: She is well-developed.  HENT:     Head: Normocephalic and atraumatic.  Cardiovascular:     Rate and Rhythm: Normal rate and regular rhythm.     Heart sounds: Normal heart sounds. No murmur.  Pulmonary:     Effort: Pulmonary effort is normal. No respiratory distress.     Breath sounds: Normal breath sounds.     Comments: Lungs clear to auscultation bilaterally. Abdominal:     General: There is no distension.     Palpations: Abdomen is soft.     Tenderness: There is no abdominal tenderness.  Musculoskeletal: Normal range of motion.  Skin:    General: Skin is warm and dry.  Neurological:     Mental Status: She is alert and oriented to person, place, and time.     Comments: Alert, oriented, thought content appropriate, able to give a coherent history. Speech is clear and goal oriented, able to follow commands.  Cranial Nerves:  II:  Peripheral visual fields grossly normal, pupils equal, round, reactive to light III, IV, VI: EOM intact bilaterally, ptosis not present V,VII: smile symmetric, eyes kept closed tightly against resistance, facial light touch sensation equal VIII: hearing grossly normal IX, X: symmetric soft palate movement, uvula elevates symmetrically  XI: bilateral shoulder shrug symmetric and strong XII: midline tongue extension 5/5 muscle strength in upper and lower extremities bilaterally including strong and equal grip strength and dorsiflexion/plantar  flexion Sensory to light touch normal in all four extremities.      ED Treatments / Results  Labs (all labs ordered are listed, but only abnormal results are displayed) Labs Reviewed  BASIC METABOLIC PANEL - Abnormal; Notable for the following components:      Result Value   Glucose, Bld 104 (*)    BUN 5 (*)    Calcium 8.8 (*)    All other components within normal limits  CBC - Abnormal; Notable for the following components:   WBC 13.4 (*)    Platelets 401 (*)    All other components within normal limits  I-STAT BETA HCG BLOOD, ED ([facility], [facility], [facility] ONLY)    EKG EKG Interpretation  Date/Time:  Monday March 11 2018 18:07:01 EST Ventricular Rate:  93 PR Interval:  166 QRS Duration: 76 QT Interval:  350 QTC Calculation: 435 R Axis:   44 Text Interpretation:  Normal sinus rhythm Normal ECG no wpw prolonged qt or brugada Confirmed by Melene PlanFloyd, Dan [phone]([id number]) on 03/11/2018 10:42:04 PM   Radiology Dg Chest 2 View  Result Date: 03/11/2018 CLINICAL DATA:  Acute onset of cough and fever. Syncope. EXAM: CHEST - 2 VIEW COMPARISON:  Chest radiograph performed 06/29/2016 FINDINGS: The lungs are well-aerated and clear. There is no evidence of focal opacification, pleural effusion or pneumothorax. The heart is normal in size; the mediastinal contour is within normal limits. No acute osseous abnormalities are seen. IMPRESSION: No acute cardiopulmonary process seen. Electronically Signed   By: Roanna RaiderJeffery  Chang M.D.   On: 03/11/2018 21:50    Procedures Procedures (including critical care time)  Medications Ordered in ED Medications  sodium chloride 0.9 % bolus 1,000 mL (1,000 mLs Intravenous New Bag/Given 03/11/18 2125)    Followed by  sodium chloride 0.9 % bolus 1,000 mL (1,000 mLs Intravenous New Bag/Given 03/11/18 2124)  ondansetron (ZOFRAN) injection 4 mg (4 mg Intravenous Given 03/11/18 2125)  lidocaine (XYLOCAINE) 2 % viscous mouth solution 15 mL (15 mLs Mouth/Throat Given 03/11/18 2116)       Initial Impression / Assessment and Plan / ED Course  I have reviewed the triage vital signs and the nursing notes.  Pertinent labs & imaging results that were available during my care of the patient were reviewed by me and considered in my medical decision making (see chart for details).    Kaitlyn Moreno is a 22 y.o. female who presents to ED for flulike symptoms with a syncopal episode while in ED at registration desk.  She did test positive for the flu 4 days ago.  She has been struggling with nausea and vomiting.  She has a history of vasodepressive dysautonomic syncope.  She reports that she frequently has syncopal episodes when she does not drink enough fluids.  Her episode today was consistent with her previous syncopal episodes.  Given she has the flu and has been struggling with p.o. intake, syncope likely secondary to dehydration and her known syncope disorder.  EKG without concerning findings.  Chest x-ray without pneumonia.  Adequately hydrated with 2 L of fluids and feels much improved. Tolerated PO here. She has ambulated without any difficulty.  She feels comfortable going home.  Reasons to return to the emergency department were discussed and all questions answered.   Final Clinical Impressions(s) / ED Diagnoses   Final diagnoses:  Cough with fever  Influenza-like illness    ED Discharge Orders    None       , Chase PicketJaime Pilcher, PA-C 03/11/18 2339    Melene PlanFloyd, Dan, DO 03/12/18 0010

## 2018-03-11 NOTE — Discharge Instructions (Signed)
It was my pleasure taking care of you today!   Increase hydration.   Rest.   Zofran as needed for nausea.   Follow up with your doctor - call tomorrow to schedule an appointment.   Return to ER for new or worsening symptoms, any additional concerns.

## 2018-03-15 DIAGNOSIS — J019 Acute sinusitis, unspecified: Secondary | ICD-10-CM | POA: Diagnosis not present

## 2018-03-28 DIAGNOSIS — R55 Syncope and collapse: Secondary | ICD-10-CM | POA: Diagnosis not present

## 2018-03-28 DIAGNOSIS — Z6837 Body mass index (BMI) 37.0-37.9, adult: Secondary | ICD-10-CM | POA: Diagnosis not present

## 2018-04-16 DIAGNOSIS — R509 Fever, unspecified: Secondary | ICD-10-CM | POA: Diagnosis not present

## 2018-04-16 DIAGNOSIS — J111 Influenza due to unidentified influenza virus with other respiratory manifestations: Secondary | ICD-10-CM | POA: Diagnosis not present

## 2018-04-16 DIAGNOSIS — H1033 Unspecified acute conjunctivitis, bilateral: Secondary | ICD-10-CM | POA: Diagnosis not present

## 2018-05-28 DIAGNOSIS — G4730 Sleep apnea, unspecified: Secondary | ICD-10-CM | POA: Diagnosis not present

## 2018-05-28 DIAGNOSIS — R6881 Early satiety: Secondary | ICD-10-CM | POA: Diagnosis not present

## 2018-05-28 DIAGNOSIS — G4710 Hypersomnia, unspecified: Secondary | ICD-10-CM | POA: Diagnosis not present

## 2018-05-28 DIAGNOSIS — R55 Syncope and collapse: Secondary | ICD-10-CM | POA: Diagnosis not present

## 2018-05-28 DIAGNOSIS — R1110 Vomiting, unspecified: Secondary | ICD-10-CM | POA: Diagnosis not present

## 2018-05-28 MED FILL — PYRIDOSTIGMINE BR 60 MG TAB: 60 | 90 days supply | Qty: 135 | Fill #0

## 2018-06-04 MED FILL — MIDODRINE HCL 2.5 MG TABLET: 2.5 | 30 days supply | Qty: 90 | Fill #0

## 2018-07-12 DIAGNOSIS — J069 Acute upper respiratory infection, unspecified: Secondary | ICD-10-CM | POA: Diagnosis not present

## 2018-07-12 DIAGNOSIS — R509 Fever, unspecified: Secondary | ICD-10-CM | POA: Diagnosis not present

## 2018-08-09 DIAGNOSIS — Z6839 Body mass index (BMI) 39.0-39.9, adult: Secondary | ICD-10-CM | POA: Diagnosis not present

## 2018-08-09 DIAGNOSIS — R21 Rash and other nonspecific skin eruption: Secondary | ICD-10-CM | POA: Diagnosis not present

## 2018-08-20 DIAGNOSIS — Q898 Other specified congenital malformations: Secondary | ICD-10-CM | POA: Diagnosis not present

## 2018-08-20 DIAGNOSIS — H47292 Other optic atrophy, left eye: Secondary | ICD-10-CM | POA: Diagnosis not present

## 2018-08-20 DIAGNOSIS — H35413 Lattice degeneration of retina, bilateral: Secondary | ICD-10-CM | POA: Diagnosis not present

## 2018-08-30 DIAGNOSIS — T7840XA Allergy, unspecified, initial encounter: Secondary | ICD-10-CM | POA: Diagnosis not present

## 2018-09-16 DIAGNOSIS — H35413 Lattice degeneration of retina, bilateral: Secondary | ICD-10-CM | POA: Diagnosis not present

## 2018-09-16 DIAGNOSIS — Q898 Other specified congenital malformations: Secondary | ICD-10-CM | POA: Diagnosis not present

## 2018-09-16 DIAGNOSIS — H53432 Sector or arcuate defects, left eye: Secondary | ICD-10-CM | POA: Diagnosis not present

## 2018-09-16 DIAGNOSIS — H47292 Other optic atrophy, left eye: Secondary | ICD-10-CM | POA: Diagnosis not present

## 2018-11-08 DIAGNOSIS — Z2914 Encounter for prophylactic rabies immune globin: Secondary | ICD-10-CM | POA: Diagnosis not present

## 2018-11-08 DIAGNOSIS — Z203 Contact with and (suspected) exposure to rabies: Secondary | ICD-10-CM | POA: Diagnosis not present

## 2018-11-08 DIAGNOSIS — Z23 Encounter for immunization: Secondary | ICD-10-CM | POA: Diagnosis not present

## 2018-11-12 DIAGNOSIS — Z2914 Encounter for prophylactic rabies immune globin: Secondary | ICD-10-CM | POA: Diagnosis not present

## 2018-11-12 DIAGNOSIS — Z203 Contact with and (suspected) exposure to rabies: Secondary | ICD-10-CM | POA: Diagnosis not present

## 2018-11-12 DIAGNOSIS — Z23 Encounter for immunization: Secondary | ICD-10-CM | POA: Diagnosis not present

## 2018-11-19 DIAGNOSIS — Z203 Contact with and (suspected) exposure to rabies: Secondary | ICD-10-CM | POA: Diagnosis not present

## 2018-11-19 DIAGNOSIS — Z2914 Encounter for prophylactic rabies immune globin: Secondary | ICD-10-CM | POA: Diagnosis not present

## 2018-11-19 DIAGNOSIS — Z23 Encounter for immunization: Secondary | ICD-10-CM | POA: Diagnosis not present

## 2018-11-27 DIAGNOSIS — Z2914 Encounter for prophylactic rabies immune globin: Secondary | ICD-10-CM | POA: Diagnosis not present

## 2018-11-27 DIAGNOSIS — Z23 Encounter for immunization: Secondary | ICD-10-CM | POA: Diagnosis not present

## 2018-11-27 DIAGNOSIS — Z203 Contact with and (suspected) exposure to rabies: Secondary | ICD-10-CM | POA: Diagnosis not present

## 2018-12-06 IMAGING — CT CT ABD-PELV W/ CM
2 of 4 series · 9 of 46 positions shown, 10 images · IV contrast (Iodine)
Comparison: Prior CT from 07/23/2014.

CLINICAL DATA: Initial evaluation for sepsis, worsening pain and
fever. Recent UTI.

EXAM:
CT ABDOMEN AND PELVIS WITH CONTRAST
TECHNIQUE: Multidetector CT imaging of the abdomen and pelvis was performed
using the standard protocol following bolus administration of
intravenous contrast.
CONTRAST:  Under cc of Isovue-DKK.

[Series 201: routine, idose (2) · axial · 0.90mm/px · z∈[+77,+457]mm · 6 of 96 slices shown, 7 images]
[im 12/96  soft-tissue]
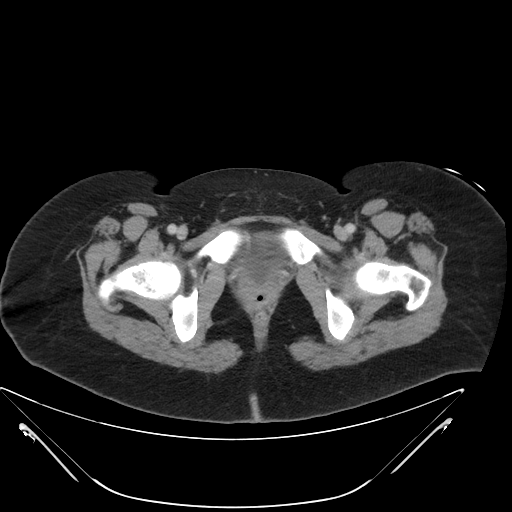
[im 12/96  bone]
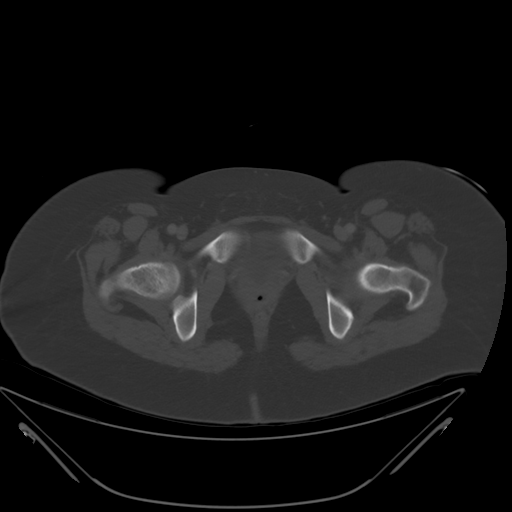
[im 27/96  soft-tissue]
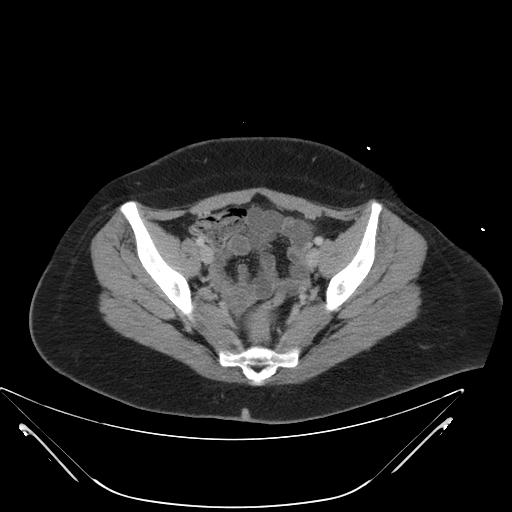
[im 42/96  soft-tissue]
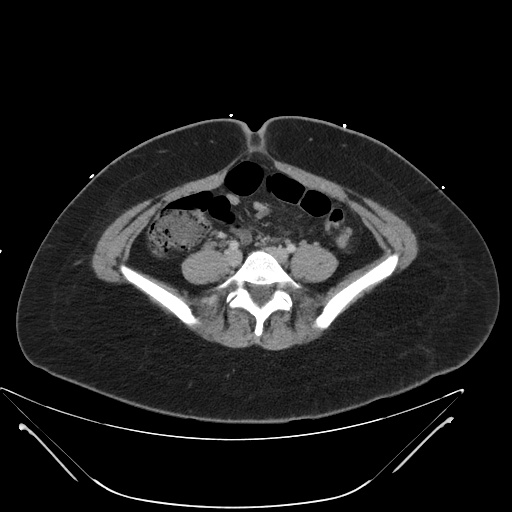
[im 58/96  soft-tissue]
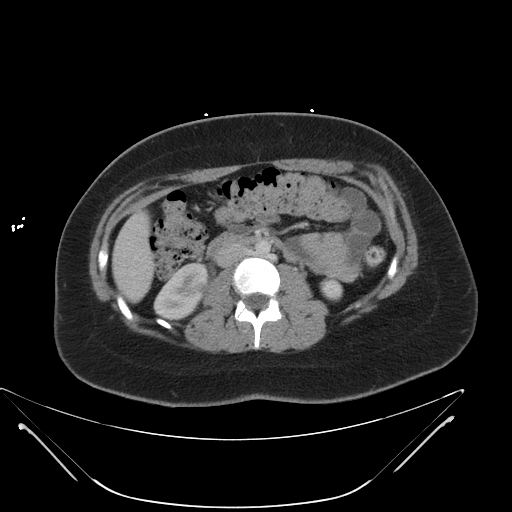
[im 73/96  soft-tissue]
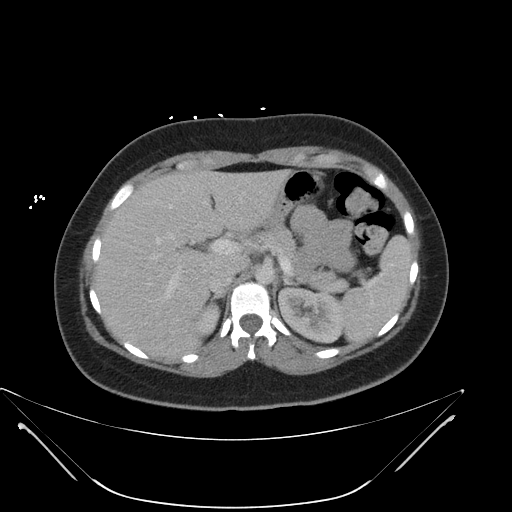
[im 88/96  soft-tissue]
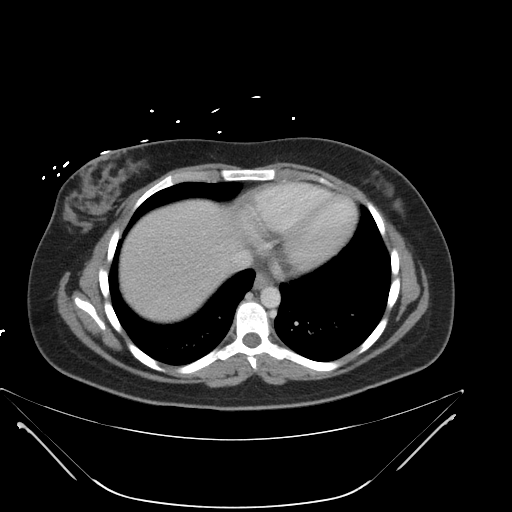

[Series 203: coronals, idose (2) · coronal · 0.45mm/px · 3 of 129 slices shown]
[im 43/129  soft-tissue]
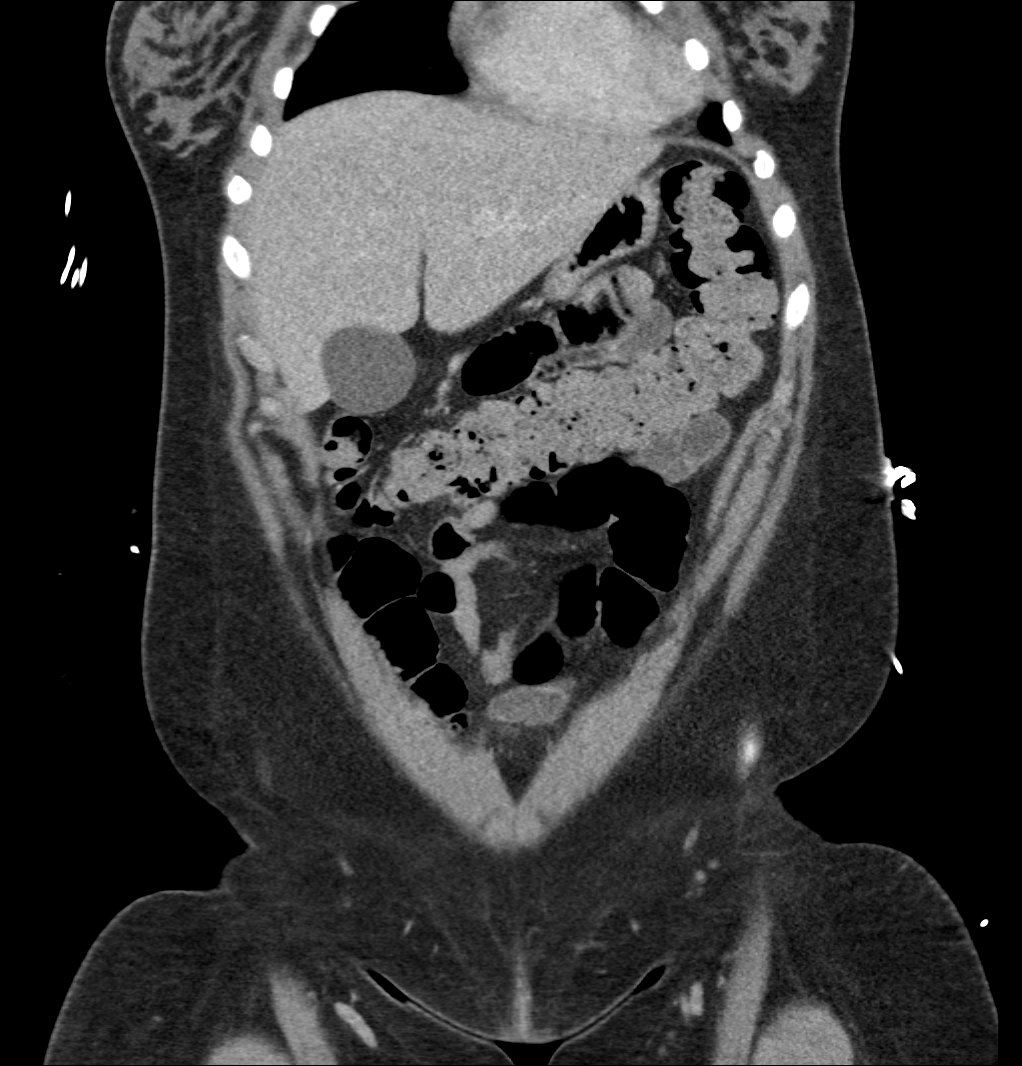
[im 57/129  soft-tissue]
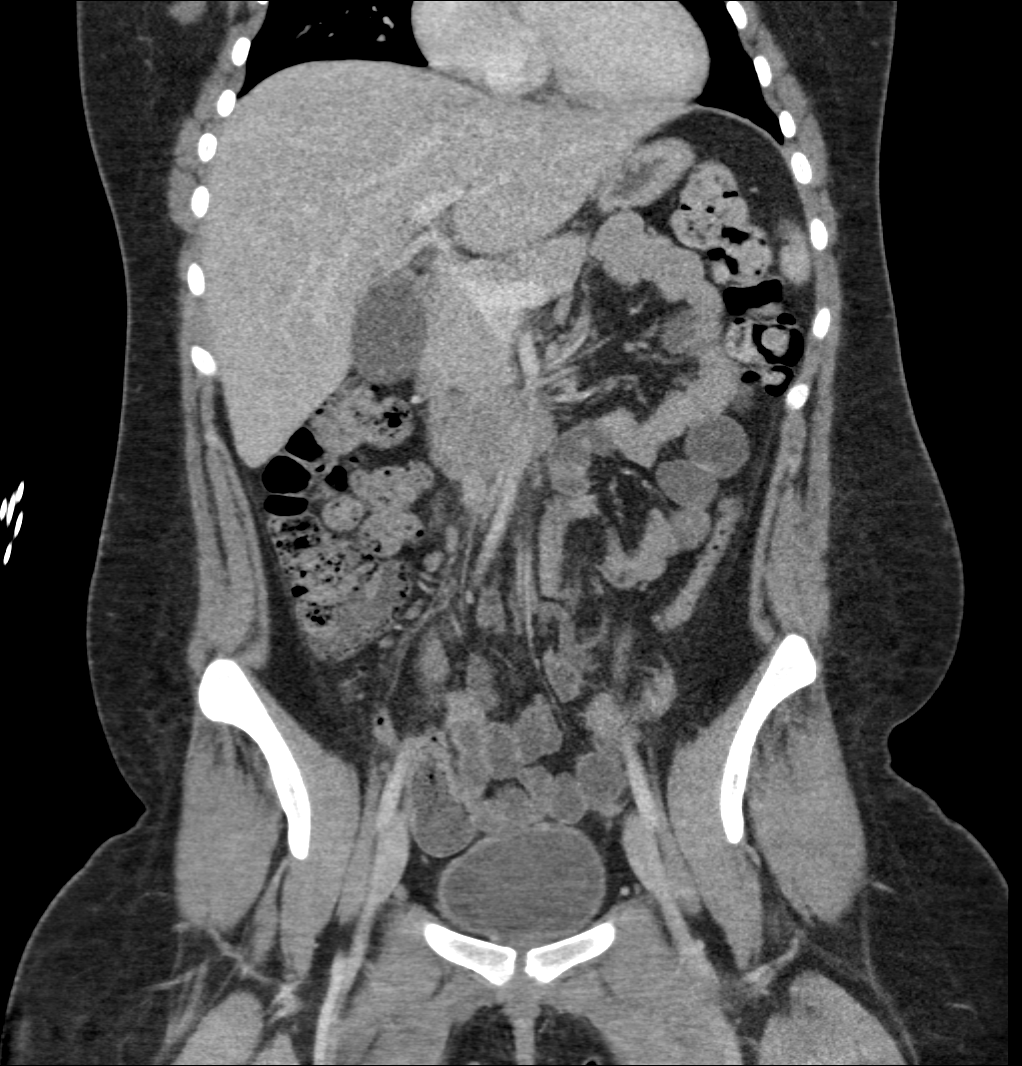
[im 72/129  soft-tissue]
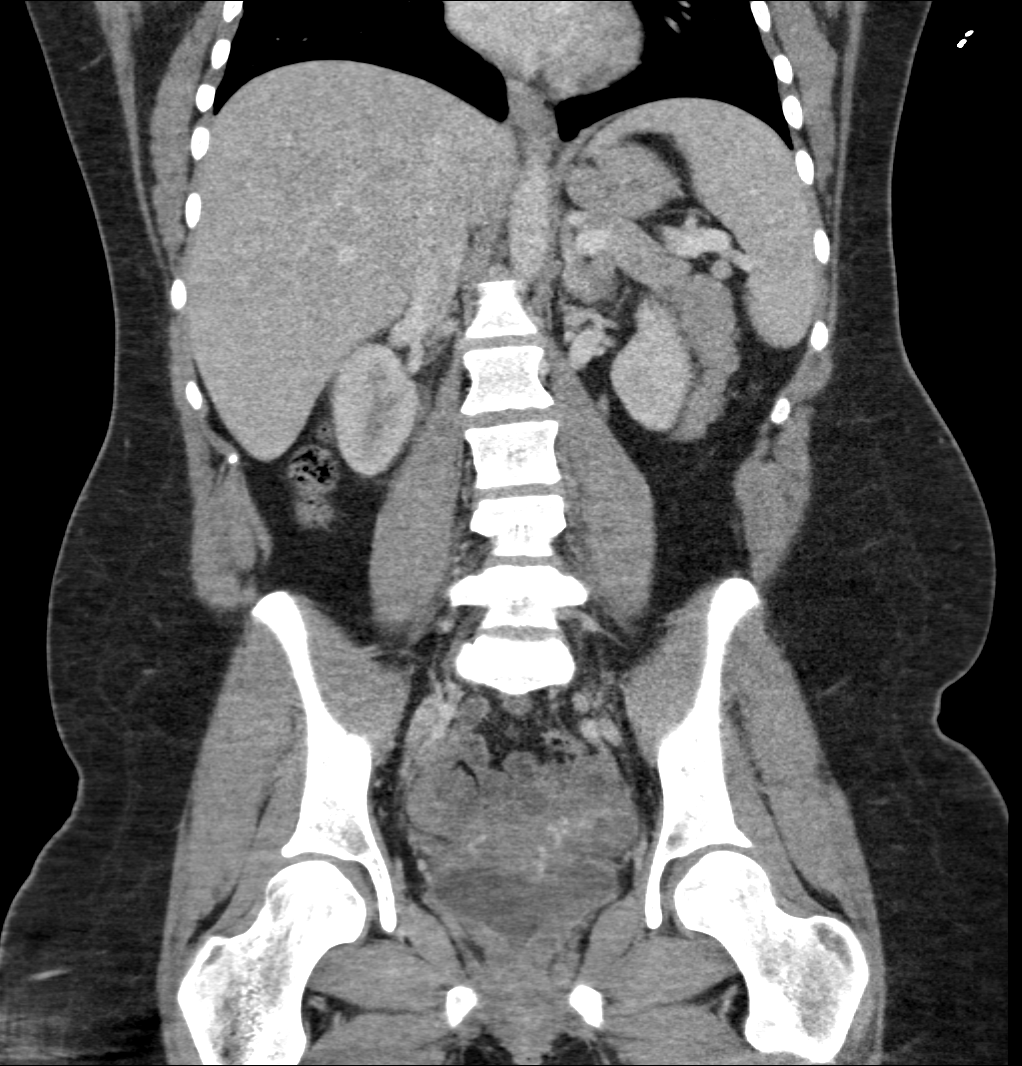

[9 of 46 positions shown; findings below may reference images not displayed]

FINDINGS: Lower chest: Visualized lung bases are clear.

Hepatobiliary: Liver demonstrates a normal contrast enhanced
appearance. Gallbladder normal. No biliary dilatation.

Pancreas: Pancreas within normal limits.

Spleen: Spleen within normal limits.

Adrenals/Urinary Tract: Adrenal glands are normal. Kidneys equal in
size. Subtly decreased nephrogram within the right kidney as
compared to the left. Parenchymal striation present at the lower
pole. Subtle hazy stranding about the right renal pelvis and right
ureter. Constellation of findings suspicious for upper urinary tract
infection with mild pyelonephritis. Relative sparing of the left
kidney which is normal in appearance. No significant inflammatory
changes about the left renal collecting system. No nephrolithiasis
or hydronephrosis. No focal enhancing renal mass.

Bladder partially distended without acute abnormality.

Stomach/Bowel: Stomach within normal limits. No evidence for bowel
obstruction. Appendix normal. No acute inflammatory changes about
the bowels.

Vascular/Lymphatic: Normal intravascular enhancement seen throughout
the intra-abdominal aorta and its branch vessels. No pathologically
enlarged intra-abdominal or pelvic lymph nodes.

Reproductive: IUD in place within the uterus. Uterus and ovaries
otherwise unremarkable.

Other: Gy stasis of the rectus abdominis musculature with small fat
containing paraumbilical hernia. No free air or fluid.

Musculoskeletal: No acute osseus abnormality. No worrisome lytic or
blastic osseous lesions.
IMPRESSION: 1. Subtle striation within the right renal nephrogram with hazy
inflammatory stranding about the right renal collecting system.
Findings concerning for acute pyelonephritis with right-sided upper
urinary tract infection. Relative sparing of the left kidney and
left renal collecting system at this time.
2. No other acute intra-abdominal or pelvic process.

## 2018-12-19 DIAGNOSIS — L299 Pruritus, unspecified: Secondary | ICD-10-CM | POA: Diagnosis not present

## 2018-12-19 DIAGNOSIS — L509 Urticaria, unspecified: Secondary | ICD-10-CM | POA: Diagnosis not present

## 2018-12-19 DIAGNOSIS — T7840XA Allergy, unspecified, initial encounter: Secondary | ICD-10-CM | POA: Diagnosis not present

## 2018-12-19 DIAGNOSIS — R0602 Shortness of breath: Secondary | ICD-10-CM | POA: Diagnosis not present

## 2018-12-19 DIAGNOSIS — R000 Tachycardia, unspecified: Secondary | ICD-10-CM | POA: Diagnosis not present

## 2019-01-07 DIAGNOSIS — S5012XA Contusion of left forearm, initial encounter: Secondary | ICD-10-CM | POA: Diagnosis not present

## 2019-01-07 DIAGNOSIS — S59912A Unspecified injury of left forearm, initial encounter: Secondary | ICD-10-CM | POA: Diagnosis not present

## 2019-01-22 DIAGNOSIS — Z6837 Body mass index (BMI) 37.0-37.9, adult: Secondary | ICD-10-CM | POA: Diagnosis not present

## 2019-01-22 DIAGNOSIS — Z23 Encounter for immunization: Secondary | ICD-10-CM | POA: Diagnosis not present

## 2019-01-22 DIAGNOSIS — S199XXA Unspecified injury of neck, initial encounter: Secondary | ICD-10-CM | POA: Diagnosis not present

## 2019-02-09 DIAGNOSIS — R112 Nausea with vomiting, unspecified: Secondary | ICD-10-CM | POA: Diagnosis not present

## 2019-02-09 DIAGNOSIS — T39315A Adverse effect of propionic acid derivatives, initial encounter: Secondary | ICD-10-CM | POA: Diagnosis not present

## 2019-02-09 DIAGNOSIS — T886XXA Anaphylactic reaction due to adverse effect of correct drug or medicament properly administered, initial encounter: Secondary | ICD-10-CM | POA: Diagnosis not present

## 2019-02-09 DIAGNOSIS — R0902 Hypoxemia: Secondary | ICD-10-CM | POA: Diagnosis not present

## 2019-02-09 DIAGNOSIS — T7840XA Allergy, unspecified, initial encounter: Secondary | ICD-10-CM | POA: Diagnosis not present

## 2019-02-09 DIAGNOSIS — L509 Urticaria, unspecified: Secondary | ICD-10-CM | POA: Diagnosis not present

## 2019-02-09 DIAGNOSIS — Z9102 Food additives allergy status: Secondary | ICD-10-CM | POA: Diagnosis not present

## 2019-02-09 DIAGNOSIS — T782XXA Anaphylactic shock, unspecified, initial encounter: Secondary | ICD-10-CM | POA: Diagnosis not present

## 2019-02-09 DIAGNOSIS — R000 Tachycardia, unspecified: Secondary | ICD-10-CM | POA: Diagnosis not present

## 2019-02-11 DIAGNOSIS — R000 Tachycardia, unspecified: Secondary | ICD-10-CM | POA: Diagnosis not present

## 2019-02-11 DIAGNOSIS — R140 Abdominal distension (gaseous): Secondary | ICD-10-CM | POA: Diagnosis not present

## 2019-02-11 DIAGNOSIS — E162 Hypoglycemia, unspecified: Secondary | ICD-10-CM | POA: Diagnosis not present

## 2019-02-11 DIAGNOSIS — R002 Palpitations: Secondary | ICD-10-CM | POA: Diagnosis not present

## 2019-02-11 DIAGNOSIS — I959 Hypotension, unspecified: Secondary | ICD-10-CM | POA: Diagnosis not present

## 2019-02-11 DIAGNOSIS — R55 Syncope and collapse: Secondary | ICD-10-CM | POA: Diagnosis not present

## 2019-02-11 DIAGNOSIS — R42 Dizziness and giddiness: Secondary | ICD-10-CM | POA: Diagnosis not present

## 2019-02-11 DIAGNOSIS — K5900 Constipation, unspecified: Secondary | ICD-10-CM | POA: Diagnosis not present

## 2019-02-11 DIAGNOSIS — R001 Bradycardia, unspecified: Secondary | ICD-10-CM | POA: Diagnosis not present

## 2019-02-17 DIAGNOSIS — R002 Palpitations: Secondary | ICD-10-CM | POA: Diagnosis not present

## 2019-02-17 DIAGNOSIS — R000 Tachycardia, unspecified: Secondary | ICD-10-CM | POA: Diagnosis not present

## 2019-02-17 DIAGNOSIS — R001 Bradycardia, unspecified: Secondary | ICD-10-CM | POA: Diagnosis not present

## 2019-02-17 DIAGNOSIS — R55 Syncope and collapse: Secondary | ICD-10-CM | POA: Diagnosis not present

## 2019-03-03 DIAGNOSIS — D729 Disorder of white blood cells, unspecified: Secondary | ICD-10-CM | POA: Diagnosis not present

## 2019-03-03 DIAGNOSIS — R5383 Other fatigue: Secondary | ICD-10-CM | POA: Diagnosis not present

## 2019-03-03 DIAGNOSIS — R634 Abnormal weight loss: Secondary | ICD-10-CM | POA: Diagnosis not present

## 2019-03-03 DIAGNOSIS — R112 Nausea with vomiting, unspecified: Secondary | ICD-10-CM | POA: Diagnosis not present

## 2019-03-15 DIAGNOSIS — Z20822 Contact with and (suspected) exposure to covid-19: Secondary | ICD-10-CM | POA: Diagnosis not present

## 2019-03-17 MED FILL — MIDODRINE HCL 2.5 MG TABLET: 2.5 | 90 days supply | Qty: 270 | Fill #0

## 2019-03-20 MED FILL — PYRIDOSTIGMINE BR 60 MG TAB: 60 | 90 days supply | Qty: 135 | Fill #0

## 2019-03-25 DIAGNOSIS — R1110 Vomiting, unspecified: Secondary | ICD-10-CM | POA: Diagnosis not present

## 2019-03-25 DIAGNOSIS — R55 Syncope and collapse: Secondary | ICD-10-CM | POA: Diagnosis not present

## 2019-03-25 DIAGNOSIS — R002 Palpitations: Secondary | ICD-10-CM | POA: Diagnosis not present

## 2019-03-25 DIAGNOSIS — E860 Dehydration: Secondary | ICD-10-CM | POA: Diagnosis not present

## 2019-03-25 MED FILL — PYRIDOSTIGMINE BR 60 MG TAB: 60 | 30 days supply | Qty: 90 | Fill #0

## 2019-04-02 DIAGNOSIS — R55 Syncope and collapse: Secondary | ICD-10-CM | POA: Diagnosis not present

## 2019-04-04 DIAGNOSIS — R112 Nausea with vomiting, unspecified: Secondary | ICD-10-CM | POA: Diagnosis not present

## 2019-04-04 DIAGNOSIS — K5900 Constipation, unspecified: Secondary | ICD-10-CM | POA: Diagnosis not present

## 2019-04-04 DIAGNOSIS — Z01812 Encounter for preprocedural laboratory examination: Secondary | ICD-10-CM | POA: Diagnosis not present

## 2019-04-04 DIAGNOSIS — R634 Abnormal weight loss: Secondary | ICD-10-CM | POA: Diagnosis not present

## 2019-04-09 DIAGNOSIS — R55 Syncope and collapse: Secondary | ICD-10-CM | POA: Diagnosis not present

## 2019-04-16 DIAGNOSIS — R55 Syncope and collapse: Secondary | ICD-10-CM | POA: Diagnosis not present

## 2019-04-21 DIAGNOSIS — R112 Nausea with vomiting, unspecified: Secondary | ICD-10-CM | POA: Diagnosis not present

## 2019-04-23 DIAGNOSIS — R55 Syncope and collapse: Secondary | ICD-10-CM | POA: Diagnosis not present

## 2019-04-24 DIAGNOSIS — J301 Allergic rhinitis due to pollen: Secondary | ICD-10-CM | POA: Diagnosis not present

## 2019-04-24 DIAGNOSIS — Z23 Encounter for immunization: Secondary | ICD-10-CM | POA: Diagnosis not present

## 2019-04-24 DIAGNOSIS — T7840XA Allergy, unspecified, initial encounter: Secondary | ICD-10-CM | POA: Diagnosis not present

## 2019-04-29 DIAGNOSIS — I498 Other specified cardiac arrhythmias: Secondary | ICD-10-CM | POA: Diagnosis not present

## 2019-04-29 DIAGNOSIS — E860 Dehydration: Secondary | ICD-10-CM | POA: Diagnosis not present

## 2019-04-29 MED FILL — PYRIDOSTIGMINE BR 60 MG TAB: 60 | 30 days supply | Qty: 90 | Fill #1

## 2019-05-02 DIAGNOSIS — E860 Dehydration: Secondary | ICD-10-CM | POA: Diagnosis not present

## 2019-05-02 DIAGNOSIS — I498 Other specified cardiac arrhythmias: Secondary | ICD-10-CM | POA: Diagnosis not present

## 2019-05-06 DIAGNOSIS — E860 Dehydration: Secondary | ICD-10-CM | POA: Diagnosis not present

## 2019-05-06 DIAGNOSIS — I498 Other specified cardiac arrhythmias: Secondary | ICD-10-CM | POA: Diagnosis not present

## 2019-05-09 DIAGNOSIS — E860 Dehydration: Secondary | ICD-10-CM | POA: Diagnosis not present

## 2019-05-09 DIAGNOSIS — I498 Other specified cardiac arrhythmias: Secondary | ICD-10-CM | POA: Diagnosis not present

## 2019-05-11 DIAGNOSIS — I498 Other specified cardiac arrhythmias: Secondary | ICD-10-CM | POA: Diagnosis not present

## 2019-05-11 DIAGNOSIS — E860 Dehydration: Secondary | ICD-10-CM | POA: Diagnosis not present

## 2019-05-13 DIAGNOSIS — I498 Other specified cardiac arrhythmias: Secondary | ICD-10-CM | POA: Diagnosis not present

## 2019-05-13 DIAGNOSIS — E860 Dehydration: Secondary | ICD-10-CM | POA: Diagnosis not present

## 2019-05-14 DIAGNOSIS — I498 Other specified cardiac arrhythmias: Secondary | ICD-10-CM | POA: Diagnosis not present

## 2019-05-14 DIAGNOSIS — E860 Dehydration: Secondary | ICD-10-CM | POA: Diagnosis not present

## 2019-05-16 DIAGNOSIS — I498 Other specified cardiac arrhythmias: Secondary | ICD-10-CM | POA: Diagnosis not present

## 2019-05-16 DIAGNOSIS — E860 Dehydration: Secondary | ICD-10-CM | POA: Diagnosis not present

## 2019-05-19 DIAGNOSIS — Q898 Other specified congenital malformations: Secondary | ICD-10-CM | POA: Diagnosis not present

## 2019-05-19 DIAGNOSIS — H53432 Sector or arcuate defects, left eye: Secondary | ICD-10-CM | POA: Diagnosis not present

## 2019-05-19 DIAGNOSIS — H35413 Lattice degeneration of retina, bilateral: Secondary | ICD-10-CM | POA: Diagnosis not present

## 2019-05-20 DIAGNOSIS — E860 Dehydration: Secondary | ICD-10-CM | POA: Diagnosis not present

## 2019-05-20 DIAGNOSIS — I498 Other specified cardiac arrhythmias: Secondary | ICD-10-CM | POA: Diagnosis not present

## 2019-05-21 DIAGNOSIS — E860 Dehydration: Secondary | ICD-10-CM | POA: Diagnosis not present

## 2019-05-21 DIAGNOSIS — I498 Other specified cardiac arrhythmias: Secondary | ICD-10-CM | POA: Diagnosis not present

## 2019-05-23 DIAGNOSIS — I498 Other specified cardiac arrhythmias: Secondary | ICD-10-CM | POA: Diagnosis not present

## 2019-05-23 DIAGNOSIS — E860 Dehydration: Secondary | ICD-10-CM | POA: Diagnosis not present

## 2019-05-26 DIAGNOSIS — R6881 Early satiety: Secondary | ICD-10-CM | POA: Diagnosis not present

## 2019-05-26 DIAGNOSIS — Z01812 Encounter for preprocedural laboratory examination: Secondary | ICD-10-CM | POA: Diagnosis not present

## 2019-05-26 DIAGNOSIS — Z79899 Other long term (current) drug therapy: Secondary | ICD-10-CM | POA: Diagnosis not present

## 2019-05-26 DIAGNOSIS — J45909 Unspecified asthma, uncomplicated: Secondary | ICD-10-CM | POA: Diagnosis not present

## 2019-05-26 DIAGNOSIS — Z6836 Body mass index (BMI) 36.0-36.9, adult: Secondary | ICD-10-CM | POA: Diagnosis not present

## 2019-05-26 DIAGNOSIS — Z7952 Long term (current) use of systemic steroids: Secondary | ICD-10-CM | POA: Diagnosis not present

## 2019-05-26 DIAGNOSIS — R112 Nausea with vomiting, unspecified: Secondary | ICD-10-CM | POA: Diagnosis not present

## 2019-05-26 DIAGNOSIS — R140 Abdominal distension (gaseous): Secondary | ICD-10-CM | POA: Diagnosis not present

## 2019-05-26 DIAGNOSIS — Q898 Other specified congenital malformations: Secondary | ICD-10-CM | POA: Diagnosis not present

## 2019-05-26 DIAGNOSIS — R634 Abnormal weight loss: Secondary | ICD-10-CM | POA: Diagnosis not present

## 2019-05-27 DIAGNOSIS — I498 Other specified cardiac arrhythmias: Secondary | ICD-10-CM | POA: Diagnosis not present

## 2019-05-27 DIAGNOSIS — E860 Dehydration: Secondary | ICD-10-CM | POA: Diagnosis not present

## 2019-05-28 DIAGNOSIS — E860 Dehydration: Secondary | ICD-10-CM | POA: Diagnosis not present

## 2019-05-28 DIAGNOSIS — I498 Other specified cardiac arrhythmias: Secondary | ICD-10-CM | POA: Diagnosis not present

## 2019-05-30 DIAGNOSIS — I498 Other specified cardiac arrhythmias: Secondary | ICD-10-CM | POA: Diagnosis not present

## 2019-05-30 DIAGNOSIS — E860 Dehydration: Secondary | ICD-10-CM | POA: Diagnosis not present

## 2019-06-03 DIAGNOSIS — E860 Dehydration: Secondary | ICD-10-CM | POA: Diagnosis not present

## 2019-06-03 DIAGNOSIS — I498 Other specified cardiac arrhythmias: Secondary | ICD-10-CM | POA: Diagnosis not present

## 2019-06-04 DIAGNOSIS — I498 Other specified cardiac arrhythmias: Secondary | ICD-10-CM | POA: Diagnosis not present

## 2019-06-04 DIAGNOSIS — E860 Dehydration: Secondary | ICD-10-CM | POA: Diagnosis not present

## 2019-06-06 DIAGNOSIS — E860 Dehydration: Secondary | ICD-10-CM | POA: Diagnosis not present

## 2019-06-06 DIAGNOSIS — I498 Other specified cardiac arrhythmias: Secondary | ICD-10-CM | POA: Diagnosis not present

## 2019-06-06 DIAGNOSIS — Z6834 Body mass index (BMI) 34.0-34.9, adult: Secondary | ICD-10-CM | POA: Diagnosis not present

## 2019-06-06 DIAGNOSIS — M542 Cervicalgia: Secondary | ICD-10-CM | POA: Diagnosis not present

## 2019-06-11 DIAGNOSIS — I498 Other specified cardiac arrhythmias: Secondary | ICD-10-CM | POA: Diagnosis not present

## 2019-06-11 DIAGNOSIS — E860 Dehydration: Secondary | ICD-10-CM | POA: Diagnosis not present

## 2019-06-12 DIAGNOSIS — I498 Other specified cardiac arrhythmias: Secondary | ICD-10-CM | POA: Diagnosis not present

## 2019-06-12 DIAGNOSIS — E860 Dehydration: Secondary | ICD-10-CM | POA: Diagnosis not present

## 2019-06-18 DIAGNOSIS — I498 Other specified cardiac arrhythmias: Secondary | ICD-10-CM | POA: Diagnosis not present

## 2019-06-18 DIAGNOSIS — E860 Dehydration: Secondary | ICD-10-CM | POA: Diagnosis not present

## 2019-06-20 DIAGNOSIS — E860 Dehydration: Secondary | ICD-10-CM | POA: Diagnosis not present

## 2019-06-20 DIAGNOSIS — I498 Other specified cardiac arrhythmias: Secondary | ICD-10-CM | POA: Diagnosis not present

## 2019-06-25 DIAGNOSIS — I498 Other specified cardiac arrhythmias: Secondary | ICD-10-CM | POA: Diagnosis not present

## 2019-06-25 DIAGNOSIS — E860 Dehydration: Secondary | ICD-10-CM | POA: Diagnosis not present

## 2019-06-27 DIAGNOSIS — I498 Other specified cardiac arrhythmias: Secondary | ICD-10-CM | POA: Diagnosis not present

## 2019-06-27 DIAGNOSIS — E860 Dehydration: Secondary | ICD-10-CM | POA: Diagnosis not present

## 2019-07-01 DIAGNOSIS — G901 Familial dysautonomia [Riley-Day]: Secondary | ICD-10-CM | POA: Diagnosis not present

## 2019-07-02 DIAGNOSIS — E860 Dehydration: Secondary | ICD-10-CM | POA: Diagnosis not present

## 2019-07-02 DIAGNOSIS — I498 Other specified cardiac arrhythmias: Secondary | ICD-10-CM | POA: Diagnosis not present

## 2019-07-04 DIAGNOSIS — I498 Other specified cardiac arrhythmias: Secondary | ICD-10-CM | POA: Diagnosis not present

## 2019-07-04 DIAGNOSIS — E860 Dehydration: Secondary | ICD-10-CM | POA: Diagnosis not present

## 2019-07-09 DIAGNOSIS — E860 Dehydration: Secondary | ICD-10-CM | POA: Diagnosis not present

## 2019-07-09 DIAGNOSIS — I498 Other specified cardiac arrhythmias: Secondary | ICD-10-CM | POA: Diagnosis not present

## 2019-07-11 DIAGNOSIS — I498 Other specified cardiac arrhythmias: Secondary | ICD-10-CM | POA: Diagnosis not present

## 2019-07-11 DIAGNOSIS — E860 Dehydration: Secondary | ICD-10-CM | POA: Diagnosis not present

## 2019-07-16 DIAGNOSIS — E860 Dehydration: Secondary | ICD-10-CM | POA: Diagnosis not present

## 2019-07-16 DIAGNOSIS — I498 Other specified cardiac arrhythmias: Secondary | ICD-10-CM | POA: Diagnosis not present

## 2019-07-18 DIAGNOSIS — E860 Dehydration: Secondary | ICD-10-CM | POA: Diagnosis not present

## 2019-07-18 DIAGNOSIS — I498 Other specified cardiac arrhythmias: Secondary | ICD-10-CM | POA: Diagnosis not present

## 2019-07-23 DIAGNOSIS — E860 Dehydration: Secondary | ICD-10-CM | POA: Diagnosis not present

## 2019-07-23 DIAGNOSIS — I498 Other specified cardiac arrhythmias: Secondary | ICD-10-CM | POA: Diagnosis not present

## 2019-07-25 DIAGNOSIS — I498 Other specified cardiac arrhythmias: Secondary | ICD-10-CM | POA: Diagnosis not present

## 2019-07-25 DIAGNOSIS — E860 Dehydration: Secondary | ICD-10-CM | POA: Diagnosis not present

## 2019-07-30 DIAGNOSIS — E860 Dehydration: Secondary | ICD-10-CM | POA: Diagnosis not present

## 2019-07-30 DIAGNOSIS — I498 Other specified cardiac arrhythmias: Secondary | ICD-10-CM | POA: Diagnosis not present

## 2019-08-01 DIAGNOSIS — I498 Other specified cardiac arrhythmias: Secondary | ICD-10-CM | POA: Diagnosis not present

## 2019-08-01 DIAGNOSIS — E860 Dehydration: Secondary | ICD-10-CM | POA: Diagnosis not present

## 2019-08-05 DIAGNOSIS — E860 Dehydration: Secondary | ICD-10-CM | POA: Diagnosis not present

## 2019-08-05 DIAGNOSIS — I498 Other specified cardiac arrhythmias: Secondary | ICD-10-CM | POA: Diagnosis not present

## 2019-08-08 DIAGNOSIS — E860 Dehydration: Secondary | ICD-10-CM | POA: Diagnosis not present

## 2019-08-08 DIAGNOSIS — I498 Other specified cardiac arrhythmias: Secondary | ICD-10-CM | POA: Diagnosis not present

## 2019-08-09 DIAGNOSIS — E860 Dehydration: Secondary | ICD-10-CM | POA: Diagnosis not present

## 2019-08-09 DIAGNOSIS — I498 Other specified cardiac arrhythmias: Secondary | ICD-10-CM | POA: Diagnosis not present

## 2019-08-12 DIAGNOSIS — I498 Other specified cardiac arrhythmias: Secondary | ICD-10-CM | POA: Diagnosis not present

## 2019-08-12 DIAGNOSIS — E860 Dehydration: Secondary | ICD-10-CM | POA: Diagnosis not present

## 2019-08-13 DIAGNOSIS — E860 Dehydration: Secondary | ICD-10-CM | POA: Diagnosis not present

## 2019-08-13 DIAGNOSIS — I498 Other specified cardiac arrhythmias: Secondary | ICD-10-CM | POA: Diagnosis not present

## 2019-08-15 DIAGNOSIS — I498 Other specified cardiac arrhythmias: Secondary | ICD-10-CM | POA: Diagnosis not present

## 2019-08-15 DIAGNOSIS — E860 Dehydration: Secondary | ICD-10-CM | POA: Diagnosis not present

## 2019-08-19 DIAGNOSIS — I498 Other specified cardiac arrhythmias: Secondary | ICD-10-CM | POA: Diagnosis not present

## 2019-08-19 DIAGNOSIS — E860 Dehydration: Secondary | ICD-10-CM | POA: Diagnosis not present

## 2019-08-20 DIAGNOSIS — E860 Dehydration: Secondary | ICD-10-CM | POA: Diagnosis not present

## 2019-08-20 DIAGNOSIS — I498 Other specified cardiac arrhythmias: Secondary | ICD-10-CM | POA: Diagnosis not present

## 2019-08-22 DIAGNOSIS — I498 Other specified cardiac arrhythmias: Secondary | ICD-10-CM | POA: Diagnosis not present

## 2019-08-22 DIAGNOSIS — E860 Dehydration: Secondary | ICD-10-CM | POA: Diagnosis not present

## 2019-08-26 DIAGNOSIS — E860 Dehydration: Secondary | ICD-10-CM | POA: Diagnosis not present

## 2019-08-26 DIAGNOSIS — I498 Other specified cardiac arrhythmias: Secondary | ICD-10-CM | POA: Diagnosis not present

## 2019-08-27 DIAGNOSIS — E860 Dehydration: Secondary | ICD-10-CM | POA: Diagnosis not present

## 2019-08-27 DIAGNOSIS — I498 Other specified cardiac arrhythmias: Secondary | ICD-10-CM | POA: Diagnosis not present

## 2019-08-29 DIAGNOSIS — I498 Other specified cardiac arrhythmias: Secondary | ICD-10-CM | POA: Diagnosis not present

## 2019-08-29 DIAGNOSIS — E860 Dehydration: Secondary | ICD-10-CM | POA: Diagnosis not present

## 2019-09-02 DIAGNOSIS — E860 Dehydration: Secondary | ICD-10-CM | POA: Diagnosis not present

## 2019-09-02 DIAGNOSIS — I498 Other specified cardiac arrhythmias: Secondary | ICD-10-CM | POA: Diagnosis not present

## 2019-09-03 DIAGNOSIS — I498 Other specified cardiac arrhythmias: Secondary | ICD-10-CM | POA: Diagnosis not present

## 2019-09-03 DIAGNOSIS — E860 Dehydration: Secondary | ICD-10-CM | POA: Diagnosis not present

## 2019-09-05 DIAGNOSIS — I498 Other specified cardiac arrhythmias: Secondary | ICD-10-CM | POA: Diagnosis not present

## 2019-09-05 DIAGNOSIS — E860 Dehydration: Secondary | ICD-10-CM | POA: Diagnosis not present

## 2019-09-08 DIAGNOSIS — H53023 Refractive amblyopia, bilateral: Secondary | ICD-10-CM | POA: Diagnosis not present

## 2019-09-08 DIAGNOSIS — H538 Other visual disturbances: Secondary | ICD-10-CM | POA: Diagnosis not present

## 2019-09-08 DIAGNOSIS — Q898 Other specified congenital malformations: Secondary | ICD-10-CM | POA: Diagnosis not present

## 2019-09-09 DIAGNOSIS — E860 Dehydration: Secondary | ICD-10-CM | POA: Diagnosis not present

## 2019-09-09 DIAGNOSIS — I498 Other specified cardiac arrhythmias: Secondary | ICD-10-CM | POA: Diagnosis not present

## 2019-09-10 DIAGNOSIS — I498 Other specified cardiac arrhythmias: Secondary | ICD-10-CM | POA: Diagnosis not present

## 2019-09-10 DIAGNOSIS — E860 Dehydration: Secondary | ICD-10-CM | POA: Diagnosis not present

## 2019-09-12 DIAGNOSIS — E860 Dehydration: Secondary | ICD-10-CM | POA: Diagnosis not present

## 2019-09-12 DIAGNOSIS — I498 Other specified cardiac arrhythmias: Secondary | ICD-10-CM | POA: Diagnosis not present

## 2019-09-16 DIAGNOSIS — E860 Dehydration: Secondary | ICD-10-CM | POA: Diagnosis not present

## 2019-09-16 DIAGNOSIS — I498 Other specified cardiac arrhythmias: Secondary | ICD-10-CM | POA: Diagnosis not present

## 2019-09-16 DIAGNOSIS — Q898 Other specified congenital malformations: Secondary | ICD-10-CM | POA: Diagnosis not present

## 2019-09-16 DIAGNOSIS — H35413 Lattice degeneration of retina, bilateral: Secondary | ICD-10-CM | POA: Diagnosis not present

## 2019-09-17 DIAGNOSIS — I498 Other specified cardiac arrhythmias: Secondary | ICD-10-CM | POA: Diagnosis not present

## 2019-09-17 DIAGNOSIS — E860 Dehydration: Secondary | ICD-10-CM | POA: Diagnosis not present

## 2019-09-18 DIAGNOSIS — K5900 Constipation, unspecified: Secondary | ICD-10-CM | POA: Diagnosis not present

## 2019-09-18 DIAGNOSIS — R110 Nausea: Secondary | ICD-10-CM | POA: Diagnosis not present

## 2019-09-19 DIAGNOSIS — I498 Other specified cardiac arrhythmias: Secondary | ICD-10-CM | POA: Diagnosis not present

## 2019-09-19 DIAGNOSIS — E860 Dehydration: Secondary | ICD-10-CM | POA: Diagnosis not present

## 2019-09-19 MED FILL — MIDODRINE HCL 2.5 MG TABLET: 2.5 | 90 days supply | Qty: 270 | Fill #1

## 2019-09-19 MED FILL — PYRIDOSTIGMINE BR 60 MG TAB: 60 | 30 days supply | Qty: 90 | Fill #1

## 2019-09-23 DIAGNOSIS — E860 Dehydration: Secondary | ICD-10-CM | POA: Diagnosis not present

## 2019-09-23 DIAGNOSIS — I498 Other specified cardiac arrhythmias: Secondary | ICD-10-CM | POA: Diagnosis not present

## 2019-09-24 DIAGNOSIS — I498 Other specified cardiac arrhythmias: Secondary | ICD-10-CM | POA: Diagnosis not present

## 2019-09-24 DIAGNOSIS — E860 Dehydration: Secondary | ICD-10-CM | POA: Diagnosis not present

## 2019-09-26 DIAGNOSIS — I498 Other specified cardiac arrhythmias: Secondary | ICD-10-CM | POA: Diagnosis not present

## 2019-09-26 DIAGNOSIS — E860 Dehydration: Secondary | ICD-10-CM | POA: Diagnosis not present

## 2019-09-30 ENCOUNTER — Encounter (HOSPITAL_COMMUNITY): Payer: Self-pay

## 2019-09-30 ENCOUNTER — Emergency Department (HOSPITAL_COMMUNITY)
Admission: EM | Admit: 2019-09-30 | Discharge: 2019-10-01 | Disposition: A | Discharge status: Home / Self Care | Payer: 59 | Attending: Emergency Medicine | Admitting: Emergency Medicine

## 2019-09-30 DIAGNOSIS — I498 Other specified cardiac arrhythmias: Secondary | ICD-10-CM | POA: Diagnosis not present

## 2019-09-30 DIAGNOSIS — E860 Dehydration: Secondary | ICD-10-CM | POA: Insufficient documentation

## 2019-09-30 DIAGNOSIS — E669 Obesity, unspecified: Secondary | ICD-10-CM | POA: Diagnosis not present

## 2019-09-30 LAB — BASIC METABOLIC PANEL
Anion gap: 8 (ref 5–15)
BUN: 6 mg/dL (ref 6–20)
CO2: 24 mmol/L (ref 22–32)
Calcium: 8.4 mg/dL — ABNORMAL LOW (ref 8.9–10.3)
Chloride: 104 mmol/L (ref 98–111)
Creatinine, Ser: 0.72 mg/dL (ref 0.44–1.00)
GFR calc Af Amer: >60 mL/min (ref >60)
GFR calc non Af Amer: >60 mL/min (ref >60)
Glucose, Bld: 100 mg/dL — ABNORMAL HIGH (ref 70–99)
Potassium: 3.7 mmol/L (ref 3.5–5.1)
Sodium: 136 mmol/L (ref 135–145)

## 2019-09-30 LAB — CBC
HCT: 38.5 % (ref 36.0–46.0)
Hemoglobin: 12.4 g/dL (ref 12.0–15.0)
MCH: 30.8 pg (ref 26.0–34.0)
MCHC: 32.2 g/dL (ref 30.0–36.0)
MCV: 95.5 fL (ref 80.0–100.0)
Platelets: 380 10*3/uL (ref 150–400)
RBC: 4.03 MIL/uL (ref 3.87–5.11)
RDW: 13.3 % (ref 11.5–15.5)
WBC: 9.8 10*3/uL (ref 4.0–10.5)
nRBC: 0 % (ref 0.0–0.2)

## 2019-09-30 LAB — I-STAT BETA HCG BLOOD, ED (MC, WL, AP ONLY): I-stat hCG, quantitative: <5 m[IU]/mL (ref <5)

## 2019-09-30 NOTE — ED Triage Notes (Signed)
Pt arrives to ED w/ c/o needing IVF. Pt states she has chronic condition that requires IVF 2/week at home, however, home nurses unable to access veins. Pt endorses intermittent dizziness which she states happens when she needs IV fluids.

## 2019-10-01 DIAGNOSIS — I498 Other specified cardiac arrhythmias: Secondary | ICD-10-CM | POA: Diagnosis not present

## 2019-10-01 DIAGNOSIS — E669 Obesity, unspecified: Secondary | ICD-10-CM | POA: Diagnosis not present

## 2019-10-01 DIAGNOSIS — E860 Dehydration: Secondary | ICD-10-CM | POA: Diagnosis not present

## 2019-10-01 MED ORDER — LACTATED RINGERS IV BOLUS
1000.0000 mL | Freq: Once | INTRAVENOUS | Status: AC
  Administered 2019-10-01: 1000 mL via INTRAVENOUS

## 2019-10-01 NOTE — ED Notes (Signed)
Pt d/c home per MD order. Discharge summary reviewed with pt- pt verbalizes understanding. No s/s of acute distress noted, pt voicing no complaints at discharge - ambulatory off unit with visitor.

## 2019-10-01 NOTE — ED Notes (Signed)
Provider at bedside

## 2019-10-01 NOTE — ED Provider Notes (Signed)
[facility] EMERGENCY DEPARTMENT Provider Note   CSN: [id number] Arrival date & time: 09/30/19  1827     History Chief Complaint  Patient presents with  . Dehydration    Kaitlyn Moreno is a 24 y.o. female.  Patient is a 24 year old female with a history of pots, recurrent nausea vomiting, poor nutritional status with Stickler syndrome who from home is getting IV fluids 2 times a week but came in today because the home health nurse was unable to get IV access.  Patient reports she has had 2 days of fairly normal hydration but she is now starting to feel dizzy with sitting up or standing.  She denies any abdominal pain, chest pain, fever or infectious symptoms.  She reports she is only here to get IV fluids so that she does not get behind and start having more symptoms.  The history is provided by the patient.       Past Medical History:  Diagnosis Date  . Anxiety   . Arthritis    "right hip; hands" (06/29/2016)  . Headache    "probably weekly" (06/29/2016)  . Migraine    "probably 2 times/month" (06/29/2016)  . Murmur ~ 2007  . [name]   . Pneumonia    "multiple times" (06/29/2016)  . PONV (postoperative nausea and vomiting)   . Pyelonephritis 06/29/2016  . Stickler syndrome    "body cannot produce enough collegen; affects all the body"    Patient Active Problem List   Diagnosis Date Noted  . MDD (major depressive disorder) 08/21/2016  . MDD (major depressive disorder), single episode, severe (HCC) 08/20/2016  . Acute pyelonephritis 06/30/2016  . Pyelonephritis 06/29/2016  . Streptococcal sepsis, unspecified (HCC) 06/29/2016  . Postpartum state 06/29/2016  . Abdominal pain 06/29/2016  . Sepsis [facility])     Past Surgical History:  Procedure Laterality Date  . CLEFT PALATE REPAIR  1999; ~ 2005  . INTRAUTERINE DEVICE INSERTION  06/2016  . TYMPANOSTOMY TUBE PLACEMENT Bilateral X 7     OB History   No obstetric history on file.      Family History  Problem Relation Age of Onset  . Diabetes Mother   . Stickler syndrome Father     Social History   Tobacco Use  . Smoking status: Never Smoker  . Smokeless tobacco: Never Used  Substance Use Topics  . Alcohol use: No  . Drug use: No    Home Medications Prior to Admission medications   Medication Sig Start Date End Date Taking? Authorizing Provider  ondansetron (ZOFRAN) 4 MG tablet Take 1 tablet (4 mg total) by mouth every 8 (eight) hours as needed for nausea or vomiting. 12/21/17   Arthor Captain, PA-C  traZODone (DESYREL) 50 MG tablet Take 1 tablet (50 mg) at bedtime: For sleep 08/24/16   Armandina Stammer I, NP  venlafaxine XR (EFFEXOR-XR) 75 MG 24 hr capsule Take 1 capsule (75 mg total) by mouth daily with breakfast. For depression 08/25/16   Armandina Stammer I, NP    Allergies    Ibuprofen, Strawberry extract, Timothy grass pollen allergen, Kiwi extract, Percocet [oxycodone-acetaminophen], and Red dye  Review of Systems   Review of Systems  All other systems reviewed and are negative.   Physical Exam Updated Vital Signs BP 115/85   Pulse 87   Temp 98.3 F (36.8 C) (Oral)   Resp 16   SpO2 100%   Physical Exam Vitals and nursing note reviewed.  Constitutional:  General: She is not in acute distress.    Appearance: Normal appearance. She is well-developed. She is obese.  HENT:     Head: Normocephalic and atraumatic.     Mouth/Throat:     Mouth: Mucous membranes are moist.  Eyes:     Pupils: Pupils are equal, round, and reactive to light.  Cardiovascular:     Rate and Rhythm: Normal rate and regular rhythm.     Heart sounds: Normal heart sounds. No murmur heard.  No friction rub.  Pulmonary:     Effort: Pulmonary effort is normal.     Breath sounds: Normal breath sounds. No wheezing or rales.  Abdominal:     General: Bowel sounds are normal. There is no distension.     Palpations: Abdomen is soft.     Tenderness: There is no abdominal  tenderness. There is no guarding or rebound.  Musculoskeletal:        General: No tenderness. Normal range of motion.     Cervical back: Normal range of motion and neck supple.     Right lower leg: No edema.     Left lower leg: No edema.     Comments: No edema.  Multiple IV sticks present on bilateral upper extremities  Skin:    General: Skin is warm and dry.     Findings: No rash.  Neurological:     General: No focal deficit present.     Mental Status: She is alert and oriented to person, place, and time. Mental status is at baseline.     Cranial Nerves: No cranial nerve deficit.  Psychiatric:        Mood and Affect: Mood normal.        Behavior: Behavior normal.        Thought Content: Thought content normal.     ED Results / Procedures / Treatments   Labs (all labs ordered are listed, but only abnormal results are displayed) Labs Reviewed  BASIC METABOLIC PANEL - Abnormal; Notable for the following components:      Result Value   Glucose, Bld 100 (*)    Calcium 8.4 (*)    All other components within normal limits  CBC  I-STAT BETA HCG BLOOD, ED ([facility], [facility], [facility] ONLY)    EKG None  Radiology No results found.  Procedures Procedures (including critical care time)  Medications Ordered in ED Medications  lactated ringers bolus 1,000 mL (has no administration in time range)    ED Course  I have reviewed the triage vital signs and the nursing notes.  Pertinent labs & imaging results that were available during my care of the patient were reviewed by me and considered in my medical decision making (see chart for details).    MDM Rules/Calculators/A&P                          Patient with multiple medical problems resulting in IV hydration 2 times a week from home but presenting today because she was unable to gain IV access.  Patient requesting IV fluids due to starting to have symptoms of dizziness which is related to her chronic disease.  Labs including hCG, CBC and  BMP without acute findings.  Patient is here for IV fluids and will attempt IV access and give 1 L of LR and plan on discharging home.  Final Clinical Impression(s) / ED Diagnoses Final diagnoses:  Dehydration    Rx / DC Orders ED Discharge  Orders    None       Gwyneth Sprout, MD 10/01/19 [phone]

## 2019-10-03 DIAGNOSIS — E860 Dehydration: Secondary | ICD-10-CM | POA: Diagnosis not present

## 2019-10-03 DIAGNOSIS — I498 Other specified cardiac arrhythmias: Secondary | ICD-10-CM | POA: Diagnosis not present

## 2019-10-07 DIAGNOSIS — E860 Dehydration: Secondary | ICD-10-CM | POA: Diagnosis not present

## 2019-10-07 DIAGNOSIS — I498 Other specified cardiac arrhythmias: Secondary | ICD-10-CM | POA: Diagnosis not present

## 2019-10-08 DIAGNOSIS — I498 Other specified cardiac arrhythmias: Secondary | ICD-10-CM | POA: Diagnosis not present

## 2019-10-08 DIAGNOSIS — E860 Dehydration: Secondary | ICD-10-CM | POA: Diagnosis not present

## 2019-10-10 DIAGNOSIS — I498 Other specified cardiac arrhythmias: Secondary | ICD-10-CM | POA: Diagnosis not present

## 2019-10-10 DIAGNOSIS — E860 Dehydration: Secondary | ICD-10-CM | POA: Diagnosis not present

## 2019-10-14 DIAGNOSIS — E860 Dehydration: Secondary | ICD-10-CM | POA: Diagnosis not present

## 2019-10-14 DIAGNOSIS — I498 Other specified cardiac arrhythmias: Secondary | ICD-10-CM | POA: Diagnosis not present

## 2019-10-15 DIAGNOSIS — I498 Other specified cardiac arrhythmias: Secondary | ICD-10-CM | POA: Diagnosis not present

## 2019-10-15 DIAGNOSIS — E860 Dehydration: Secondary | ICD-10-CM | POA: Diagnosis not present

## 2019-10-22 DIAGNOSIS — I498 Other specified cardiac arrhythmias: Secondary | ICD-10-CM | POA: Diagnosis not present

## 2019-10-22 DIAGNOSIS — E860 Dehydration: Secondary | ICD-10-CM | POA: Diagnosis not present

## 2019-10-29 DIAGNOSIS — E860 Dehydration: Secondary | ICD-10-CM | POA: Diagnosis not present

## 2019-10-29 DIAGNOSIS — I498 Other specified cardiac arrhythmias: Secondary | ICD-10-CM | POA: Diagnosis not present

## 2019-11-04 DIAGNOSIS — I498 Other specified cardiac arrhythmias: Secondary | ICD-10-CM | POA: Diagnosis not present

## 2019-11-04 DIAGNOSIS — E860 Dehydration: Secondary | ICD-10-CM | POA: Diagnosis not present

## 2019-11-06 DIAGNOSIS — I498 Other specified cardiac arrhythmias: Secondary | ICD-10-CM | POA: Diagnosis not present

## 2019-11-06 DIAGNOSIS — E860 Dehydration: Secondary | ICD-10-CM | POA: Diagnosis not present

## 2019-11-11 DIAGNOSIS — E860 Dehydration: Secondary | ICD-10-CM | POA: Diagnosis not present

## 2019-11-11 DIAGNOSIS — I498 Other specified cardiac arrhythmias: Secondary | ICD-10-CM | POA: Diagnosis not present

## 2019-11-12 DIAGNOSIS — I498 Other specified cardiac arrhythmias: Secondary | ICD-10-CM | POA: Diagnosis not present

## 2019-11-12 DIAGNOSIS — E860 Dehydration: Secondary | ICD-10-CM | POA: Diagnosis not present

## 2019-11-13 MED FILL — LINZESS 72 MCG CAPSULE: 72 | 30 days supply | Qty: 30 | Fill #0

## 2019-11-26 DIAGNOSIS — E860 Dehydration: Secondary | ICD-10-CM | POA: Diagnosis not present

## 2019-11-26 DIAGNOSIS — I498 Other specified cardiac arrhythmias: Secondary | ICD-10-CM | POA: Diagnosis not present

## 2019-12-10 DIAGNOSIS — E860 Dehydration: Secondary | ICD-10-CM | POA: Diagnosis not present

## 2019-12-10 DIAGNOSIS — I498 Other specified cardiac arrhythmias: Secondary | ICD-10-CM | POA: Diagnosis not present

## 2020-02-07 ENCOUNTER — Other Ambulatory Visit: Payer: Self-pay

## 2020-02-07 ENCOUNTER — Emergency Department (HOSPITAL_COMMUNITY)
Admission: EM | Admit: 2020-02-07 | Discharge: 2020-02-07 | Disposition: A | Discharge status: Home / Self Care | Payer: Medicaid Other | Attending: Emergency Medicine | Admitting: Emergency Medicine

## 2020-02-07 DIAGNOSIS — N3000 Acute cystitis without hematuria: Secondary | ICD-10-CM | POA: Insufficient documentation

## 2020-02-07 DIAGNOSIS — R110 Nausea: Secondary | ICD-10-CM | POA: Insufficient documentation

## 2020-02-07 DIAGNOSIS — R55 Syncope and collapse: Secondary | ICD-10-CM | POA: Diagnosis present

## 2020-02-07 DIAGNOSIS — R519 Headache, unspecified: Secondary | ICD-10-CM | POA: Diagnosis not present

## 2020-02-07 LAB — CBC WITH DIFFERENTIAL/PLATELET
Abs Immature Granulocytes: 0.03 10*3/uL (ref 0.00–0.07)
Basophils Absolute: 0 10*3/uL (ref 0.0–0.1)
Basophils Relative: 0 %
Eosinophils Absolute: 0.1 10*3/uL (ref 0.0–0.5)
Eosinophils Relative: 1 %
HCT: 36.8 % (ref 36.0–46.0)
Hemoglobin: 11.8 g/dL — ABNORMAL LOW (ref 12.0–15.0)
Immature Granulocytes: 0 %
Lymphocytes Relative: 14 %
Lymphs Abs: 1.6 10*3/uL (ref 0.7–4.0)
MCH: 30.4 pg (ref 26.0–34.0)
MCHC: 32.1 g/dL (ref 30.0–36.0)
MCV: 94.8 fL (ref 80.0–100.0)
Monocytes Absolute: 0.5 10*3/uL (ref 0.1–1.0)
Monocytes Relative: 4 %
Neutro Abs: 9.5 10*3/uL — ABNORMAL HIGH (ref 1.7–7.7)
Neutrophils Relative %: 81 %
Platelets: 317 10*3/uL (ref 150–400)
RBC: 3.88 MIL/uL (ref 3.87–5.11)
RDW: 13.2 % (ref 11.5–15.5)
WBC: 11.7 10*3/uL — ABNORMAL HIGH (ref 4.0–10.5)
nRBC: 0 % (ref 0.0–0.2)

## 2020-02-07 LAB — BASIC METABOLIC PANEL
Anion gap: 9 (ref 5–15)
BUN: 5 mg/dL — ABNORMAL LOW (ref 6–20)
CO2: 26 mmol/L (ref 22–32)
Calcium: 8.3 mg/dL — ABNORMAL LOW (ref 8.9–10.3)
Chloride: 102 mmol/L (ref 98–111)
Creatinine, Ser: 0.71 mg/dL (ref 0.44–1.00)
GFR, Estimated: >60 mL/min (ref >60)
Glucose, Bld: 116 mg/dL — ABNORMAL HIGH (ref 70–99)
Potassium: 3.5 mmol/L (ref 3.5–5.1)
Sodium: 137 mmol/L (ref 135–145)

## 2020-02-07 LAB — URINALYSIS, ROUTINE W REFLEX MICROSCOPIC
Bilirubin Urine: NEGATIVE
Glucose, UA: NEGATIVE mg/dL
Ketones, ur: NEGATIVE mg/dL
Nitrite: POSITIVE — AB
Protein, ur: NEGATIVE mg/dL
Specific Gravity, Urine: 1.009 (ref 1.005–1.030)
WBC, UA: >50 WBC/hpf — ABNORMAL HIGH (ref 0–5)
pH: 5 (ref 5.0–8.0)

## 2020-02-07 LAB — I-STAT BETA HCG BLOOD, ED (MC, WL, AP ONLY): I-stat hCG, quantitative: <5 m[IU]/mL (ref <5)

## 2020-02-07 LAB — CBG MONITORING, ED: Glucose-Capillary: 92 mg/dL (ref 70–99)

## 2020-02-07 LAB — LACTIC ACID, PLASMA: Lactic Acid, Venous: 1 mmol/L (ref 0.5–1.9)

## 2020-02-07 MED ORDER — CEPHALEXIN 500 MG PO CAPS: 500.0000 mg | ORAL_CAPSULE | Freq: Four times a day (QID) | ORAL | 0 refills | Status: AC

## 2020-02-07 MED ORDER — SODIUM CHLORIDE 0.9 % IV SOLN
1.0000 g | Freq: Once | INTRAVENOUS | Status: AC
  Administered 2020-02-07: 1 g via INTRAVENOUS
  Filled 2020-02-07: qty 10

## 2020-02-07 MED ORDER — SODIUM CHLORIDE 0.9 % IV BOLUS
1000.0000 mL | Freq: Once | INTRAVENOUS | Status: AC
  Administered 2020-02-07: 1000 mL via INTRAVENOUS

## 2020-02-07 NOTE — ED Provider Notes (Signed)
[facility] EMERGENCY DEPARTMENT Provider Note   CSN: [id number] Arrival date & time: 02/07/20  0093     History Chief Complaint  Patient presents with  . Loss of Consciousness    Kaitlyn Moreno is a 24 y.o. female.  HPI   Pt is a 24 y/o female with a h/o anxiety, arthritis, migraine, murmur, pierre robin sequence, PNA, pyelonephritis, stickler syndrome, neurocardiogenic syncope, vasodepressor syncope, dilated aortic root, POTS, who presents to the ED today for eval of syncope.  Patient has frequent history of syncope and follows with [facility] for this.  She states she had a syncopal episode today while she was in the parking lot at the gas station.  Per bystanders that were there, she did not sustain any head trauma.  She did have some possible seizure-like activity.  She did not have any urinary incontinence or tongue biting during this time.  On my evaluation she states she has no significant complaints.  She has been more fatigued over the last few days because she just started a new job and has been on her feet a lot working more than normal.  She denies any fevers, chest pain, shortness of breath, respiratory symptoms, abdominal pain, nausea, vomiting, diarrhea or urinary symptoms.  She has had UTIs in the past.  She further notes that she used to be on infusions at home but the orders recently changed so she has not been receiving her infusions for hydration and nutrition so her syncopal episodes have been more frequent.  She states that she originally did not want to come to the ED but EMS advised her to so she did. States her normal Bps are in the 80s systolic and she is on midodrine for this.   Past Medical History:  Diagnosis Date  . Anxiety   . Arthritis    "right hip; hands" (06/29/2016)  . Headache    "probably weekly" (06/29/2016)  . Migraine    "probably 2 times/month" (06/29/2016)  . Murmur ~ 2007  . [name]   . Pneumonia    "multiple  times" (06/29/2016)  . PONV (postoperative nausea and vomiting)   . Pyelonephritis 06/29/2016  . Stickler syndrome    "body cannot produce enough collegen; affects all the body"    Patient Active Problem List   Diagnosis Date Noted  . MDD (major depressive disorder) 08/21/2016  . MDD (major depressive disorder), single episode, severe (HCC) 08/20/2016  . Acute pyelonephritis 06/30/2016  . Pyelonephritis 06/29/2016  . Streptococcal sepsis, unspecified (HCC) 06/29/2016  . Postpartum state 06/29/2016  . Abdominal pain 06/29/2016  . Sepsis [facility])     Past Surgical History:  Procedure Laterality Date  . CLEFT PALATE REPAIR  1999; ~ 2005  . INTRAUTERINE DEVICE INSERTION  06/2016  . TYMPANOSTOMY TUBE PLACEMENT Bilateral X 7     OB History   No obstetric history on file.     Family History  Problem Relation Age of Onset  . Diabetes Mother   . Stickler syndrome Father     Social History   Tobacco Use  . Smoking status: Never Smoker  . Smokeless tobacco: Never Used  Substance Use Topics  . Alcohol use: No  . Drug use: No    Home Medications Prior to Admission medications   Medication Sig Start Date End Date Taking? Authorizing Provider  cephALEXin (KEFLEX) 500 MG capsule Take 1 capsule (500 mg total) by mouth 4 (four) times daily for 7 days. 02/07/20  02/14/20  Thanya Cegielski S, PA-C  ondansetron (ZOFRAN) 4 MG tablet Take 1 tablet (4 mg total) by mouth every 8 (eight) hours as needed for nausea or vomiting. 12/21/17   Arthor CaptainHarris, Abigail, PA-C  traZODone (DESYREL) 50 MG tablet Take 1 tablet (50 mg) at bedtime: For sleep 08/24/16   Armandina StammerNwoko, Agnes I, NP  venlafaxine XR (EFFEXOR-XR) 75 MG 24 hr capsule Take 1 capsule (75 mg total) by mouth daily with breakfast. For depression 08/25/16   Armandina StammerNwoko, Agnes I, NP    Allergies    Ibuprofen, Strawberry extract, Timothy grass pollen allergen, Kiwi extract, Percocet [oxycodone-acetaminophen], and Red dye  Review of Systems   Review of Systems   Constitutional: Negative for chills and fever.  HENT: Negative for ear pain and sore throat.        No tongue biting  Eyes: Negative for pain and visual disturbance.  Respiratory: Negative for cough and shortness of breath.   Cardiovascular: Negative for chest pain.  Gastrointestinal: Positive for nausea. Negative for abdominal pain, constipation, diarrhea and vomiting.  Genitourinary: Negative for dysuria and hematuria.       No urinary incontinence  Musculoskeletal: Negative for back pain.  Skin: Negative for rash.  Neurological: Positive for headaches.       No head trauma  All other systems reviewed and are negative.   Physical Exam Updated Vital Signs BP 110/76   Pulse 78   Temp 98.6 F (37 C) (Oral)   Resp (!) 28   Ht 5\' 2"  (1.575 m)   Wt 94.3 kg   SpO2 100%   BMI 38.04 kg/m   Physical Exam Vitals and nursing note reviewed.  Constitutional:      General: She is not in acute distress.    Appearance: She is well-developed.  HENT:     Head: Normocephalic and atraumatic.  Eyes:     Conjunctiva/sclera: Conjunctivae normal.  Cardiovascular:     Rate and Rhythm: Normal rate and regular rhythm.     Heart sounds: Normal heart sounds. No murmur heard.   Pulmonary:     Effort: Pulmonary effort is normal. No respiratory distress.     Breath sounds: Normal breath sounds. No wheezing, rhonchi or rales.  Abdominal:     General: Bowel sounds are normal.     Palpations: Abdomen is soft.     Tenderness: There is no abdominal tenderness. There is no guarding or rebound.  Musculoskeletal:     Cervical back: Neck supple.  Skin:    General: Skin is warm and dry.  Neurological:     Mental Status: She is alert.     Comments: Mental Status:  Alert, thought content appropriate, able to give a coherent history. Speech fluent without evidence of aphasia. Able to follow 2 step commands without difficulty.  Cranial Nerves:  II:  pupils equal, round, reactive to light III,IV, VI:  ptosis not present, extra-ocular motions intact bilaterally  V,VII: smile symmetric, facial light touch sensation equal VIII: hearing grossly normal to voice  X: uvula elevates symmetrically  XI: bilateral shoulder shrug symmetric and strong XII: midline tongue extension without fassiculations Motor:  Normal tone. 5/5 strength of BUE and BLE major muscle groups including strong and equal grip strength and dorsiflexion/plantar flexion Sensory: light touch normal in all extremities. Gait: normal gait and balance.       ED Results / Procedures / Treatments   Labs (all labs ordered are listed, but only abnormal results are displayed) Labs Reviewed  CBC  WITH DIFFERENTIAL/PLATELET - Abnormal; Notable for the following components:      Result Value   WBC 11.7 (*)    Hemoglobin 11.8 (*)    Neutro Abs 9.5 (*)    All other components within normal limits  BASIC METABOLIC PANEL - Abnormal; Notable for the following components:   Glucose, Bld 116 (*)    BUN 5 (*)    Calcium 8.3 (*)    All other components within normal limits  URINALYSIS, ROUTINE W REFLEX MICROSCOPIC - Abnormal; Notable for the following components:   APPearance CLOUDY (*)    Hgb urine dipstick SMALL (*)    Nitrite POSITIVE (*)    Leukocytes,Ua LARGE (*)    WBC, UA >50 (*)    Bacteria, UA RARE (*)    All other components within normal limits  URINE CULTURE  LACTIC ACID, PLASMA  LACTIC ACID, PLASMA  CBG MONITORING, ED  I-STAT BETA HCG BLOOD, ED ([facility], [facility], [facility] ONLY)    EKG None  Radiology No results found.  Procedures Procedures (including critical care time)  Medications Ordered in ED Medications  sodium chloride 0.9 % bolus 1,000 mL (0 mLs Intravenous Stopped 02/07/20 0815)  cefTRIAXone (ROCEPHIN) 1 g in sodium chloride 0.9 % 100 mL IVPB (1 g Intravenous New Bag/Given 02/07/20 0815)    ED Course  I have reviewed the triage vital signs and the nursing notes.  Pertinent labs & imaging results that were  available during my care of the patient were reviewed by me and considered in my medical decision making (see chart for details).    MDM Rules/Calculators/A&P                          24 year old female with frequent history of syncope which she follows at [facility] for presenting for evaluation of syncope.  No reported injuries from the syncopal episode.  Possible seizure-like activity on scene.  No loss of control of bladder function and no tongue biting.  Suspect that seizure-like activity likely related to her syncopal episode and blood pressure as she was hypotensive initially.  She is alert oriented and in tact on my evaluation.  No focal neuro deficits.  Will initiate work-up with labs, EKG, UA will give IV fluids.  Reviewed/interpreted labs CBC with marginal leukocytosis and marginal anemia BMP with normal electrolytes and kidney function Beta-hCG is negative UA with nitrites, leukocytes, consistent with UTI.  Urine culture was sent Lactic acid is wnl.   W/u reassuring. Have low suspicion for sepsis in this patient as her blood pressures are at baseline.  She is afebrile and not tachycardic.  She was given a dose of ceftriaxone in the emergency department.  We will start her on Keflex for her UTI at home. In regards to her syncope, this is a chronic issue. She is advised to follow-up with her normal EP doctors and also gave her a referral to neurology for possible seizure-like activity however have lower suspicion for an actual seizure in this patient and will hold on initiating antiepileptics at this time.  Advised on seizure precautions.  Advised on specific return precautions.  She voices understanding of the plan and reasons to return.  All questions answered.  Patient stable for discharge.  Final Clinical Impression(s) / ED Diagnoses Final diagnoses:  Syncope, unspecified syncope type  Acute cystitis without hematuria    Rx / DC Orders ED Discharge Orders         Ordered  Ambulatory referral to Neurology       Comments: An appointment is requested in approximately: 2 weeks   02/07/20 0854    cephALEXin (KEFLEX) 500 MG capsule  4 times daily        02/07/20 0901           Karrie Meres, PA-C 02/07/20 2542    Margarita Grizzle, MD 02/09/20 1249

## 2020-02-07 NOTE — ED Triage Notes (Signed)
Patient [place] [employer] [place] gas station. Per EMS stated that bystander said patient passed out and had a 3 second seizure like activity with just her head followed by a 15 minute stare. Bystanders denies that the patient hit her head.  Per EMS patient has been alert and oriented x4 the entire time. Patient VS 134/74, SpO2- 98%, CBG- 117, P-93 20g IV to left AC of NS Bolus in. When patient got here she requested to go to the bathroom before transferring to stretcher. Gait steady. Urine obtained.

## 2020-02-07 NOTE — Discharge Instructions (Addendum)
Do not drive, take baths, swim, or complete any dangerous activities until you are cleared by neurology due to your possible seizure like activity. You were given an ambulatory referral to neurology. They will reach out to you to make an appointment.   You were given a prescription for antibiotics. Please take the antibiotic prescription fully.   Please follow up with your EP physician and/or primary care provider within the next 5-7 days for reassessment. Please return to the emergency department for any new or worsening symptoms.

## 2020-02-09 LAB — URINE CULTURE: Culture: >=100000 — AB

## 2020-02-10 ENCOUNTER — Telehealth: Payer: Self-pay

## 2020-02-10 NOTE — Telephone Encounter (Signed)
Post ED Visit - Positive Culture Follow-up  Culture report reviewed by antimicrobial stewardship pharmacist: [facility] []  , Pharm.D. []  Enzo Bi, Pharm.D., BCPS AQ-ID []  , Pharm.D., BCPS []  Celedonio Miyamoto, Pharm.D., BCPS []  [place], Garvin Fila.D., BCPS, AAHIVP []  , Pharm.D., BCPS, AAHIVP []  Georgina Pillion, PharmD, BCPS []  , PharmD, BCPS []  [place], PharmD, BCPS []  [place], PharmD []  , PharmD, BCPS []  Estella Husk, PharmD [facility] []  Lysle Pearl, PharmD []  , PharmD []  Phillips Climes, PharmD []  , Rph []  Agapito Games) , PharmD []  Verlan Friends, PharmD []  , PharmD []  Mervyn Gay, PharmD []  , PharmD []  Vinnie Level, PharmD []  Bufford Lope, PharmD []  , PharmD []  Len Childs, PharmD   Positive urine culture Treated with Cephalexin, organism sensitive to the same and no further patient follow-up is required at this time.  02/10/2020, 11:37 AM

## 2020-04-27 ENCOUNTER — Ambulatory Visit (INDEPENDENT_AMBULATORY_CARE_PROVIDER_SITE_OTHER): Payer: BC Managed Care – PPO | Admitting: Neurology

## 2020-04-27 ENCOUNTER — Telehealth: Payer: Self-pay | Admitting: Neurology

## 2020-04-27 ENCOUNTER — Encounter: Payer: Self-pay | Admitting: Neurology

## 2020-04-27 VITALS — BP 129/87 | HR 84 | Ht 62.0 in | Wt 222.0 lb

## 2020-04-27 DIAGNOSIS — R55 Syncope and collapse: Secondary | ICD-10-CM | POA: Diagnosis not present

## 2020-04-27 NOTE — Progress Notes (Signed)
[facility]    Provider:  Dr Lucia Gaskins Requesting Provider: Karrie Meres, PA-C Primary Care Provider:  Marylen Ponto, MD  CC:  Syncope  HPI:  Kaitlyn Moreno is a 25 y.o. female here as requested by Karrie Meres, PA-C for syncope which is known and followed by [facility]. She has a PMHx of vasovagal syncope and orthostatic intolerance. She had one of her usual episodes, in the setting of hypotension, so the ED sent her here for evaluation of unlikely seizures. Patient states this time she had some convulsive activity the last episode. She started having syncopal episodes in HS with syncopal events. She was evaluated at [facility], tilt table, holter monitors, diagnosed her with POTS. She had an episode, she did not feel good, She was in [place] near [place] and she had a syncopal episode, this time she had a bad headache and lost consciousness, this episode was different, she lost consciousness for 5 minutes, she was "turning blue" lips and fingers, it was witnessed by a Transport planner, the store employee and PD, they said she stiffened uo and was shaking, her head was thrown back, she did not urinate or defecate, she bit her lip. She follows with [facility] for 6 months to a year. She has a large family history of epilepsy, her son had seizures in infancy and stopped at the age of 35, her first cousin, no parents no siblings. Her usual episodes are brief and not as long lasting as the most current episode 11/27. She was standing during this most recent episode. One other episode where she urinated and vomited on herself in the past she found herslef on the ground and she felt confused like 2-3 years ago, She has syncopal events every 1-2 weeks and pre-syncope is daily worse over the last 3 months as she has been struggling with eating and dehydration and nausea. She says she was "pso ictal" they said she was confused, did not know the president.  This is a chronic issues. She has a support dog who  alerts her to low blood pressure. She has a history of syncopal episodes. She states BP fluctuation is typical of her dysautonomia. She has a long history of syncopal events and she is treated at [facility].   Reviewed notes, labs and imaging from outside physicians, which showed :  MRI 11/20/2016: reviewed report  INDICATION: optic nerve pallor, left eye, H53.432 Sector or arcuate  defects, left eye    COMPARISON: None   TECHNIQUE/PROTOCOL: Standard orbits pre and post contrast protocol MRI  performed, including additional imaging pre and post contrast of the brain.   CONTRAST: 14mL MultiHance IV. This MRI was performed before and after IV  administration of contrast material. IV contrast was administered to  improve disease detection and further define anatomy.  *  GFR: None.  *  Complications: No immediate patient complications or events noted.   FINDINGS:  Globes: Mild bilateral staphylomas.  Lenses: Normal appearing native lenses.  Optic nerves: Unremarkable appearance of the optic nerves.  Intraconal fat: Normal.  Intraocular hemorrhage: None.  Extraocular muscles: Normal.  Intraorbital vasculature: Normal.  Lacrimal glands: Normal.   Brain Parenchyma: No mass, mass effect, acute infarct or hemorrhage.  Intracranial flow voids: Normal.  Extra-axial spaces, basal cisterns, ventricles and sulci: Normal.  Cranium: Normal.  Paranasal sinuses: Scattered mucosal thickening left ethmoid air cells.  Mastoid sinuses: Normal.   IMPRESSION:  1. Bilateral orbital staphylomas.   2. Normal appearance ofthe bilateral optic  nerves.   3. Unremarkable contrast enhanced MRI brain.   Cbc elevated wbc 11.7, hgb 11.8 otherwise nml , bmp unremarkable, UA cloudy +nitrites and hgb, large leukocytes, >50 wbcs  Review of Systems: Patient complains of symptoms per HPI as well as the following symptoms: syncope. Pertinent negatives and positives per HPI. All others negative.   Social  History   Socioeconomic History  . Marital status: Divorced    Spouse name: Not on file  . Number of children: 2  . Years of education: Not on file  . Highest education level: Not on file  Occupational History  . Not on file  Tobacco Use  . Smoking status: Never Smoker  . Smokeless tobacco: Never Used  Substance and Sexual Activity  . Alcohol use: No  . Drug use: No  . Sexual activity: Yes    Birth control/protection: Condom, Surgical  Other Topics Concern  . Not on file  Social History Narrative   Lives at home with children and mother   Social Determinants of Health   Financial Resource Strain: Not on file  Food Insecurity: Not on file  Transportation Needs: Not on file  Physical Activity: Not on file  Stress: Not on file  Social Connections: Not on file  Intimate Partner Violence: Not on file    Family History  Problem Relation Age of Onset  . Diabetes Mother   . Stickler syndrome Father     Past Medical History:  Diagnosis Date  . Anxiety   . Arthritis    "right hip; hands" (06/29/2016)  . Dysautonomia (HCC)   . Headache    "probably weekly" (06/29/2016)  . Hearing loss   . Migraine    "probably 2 times/month" (06/29/2016)  . Murmur ~ 2007  . Neurocardiogenic syncope   . [name]   . Pneumonia    "multiple times" (06/29/2016)  . PONV (postoperative nausea and vomiting)   . POTS (postural orthostatic tachycardia syndrome)   . Pyelonephritis 06/29/2016  . 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 Vision loss     Patient Active Problem List   Diagnosis Date Noted  . MDD (major depressive disorder) 08/21/2016  . MDD (major depressive disorder), single episode, severe (HCC) 08/20/2016  . Acute pyelonephritis 06/30/2016  . Pyelonephritis 06/29/2016  . Streptococcal sepsis, unspecified (HCC) 06/29/2016  . Postpartum state 06/29/2016  . Abdominal pain 06/29/2016  . Sepsis [facility])     Past Surgical  History:  Procedure Laterality Date  . CLEFT PALATE REPAIR  1999; ~ 2005  . INTRAUTERINE DEVICE INSERTION  06/2016  . TYMPANOSTOMY TUBE PLACEMENT Bilateral X 7    Current Outpatient Medications  Medication Sig Dispense Refill  . EPINEPHrine 0.3 mg/0.3 mL IJ SOAJ injection as needed.    . fludrocortisone (FLORINEF) 0.1 MG tablet Take 100 mcg by mouth daily.    Marland Kitchen LINZESS 72 MCG capsule Take 72 mcg by mouth daily.    . midodrine (PROAMATINE) 2.5 MG tablet Take 2.5 mg by mouth 3 (three) times daily.    Marland Kitchen pyridostigmine (MESTINON) 60 MG tablet Take 60 mg by mouth 3 (three) times daily.    . traZODone (DESYREL) 50 MG tablet Take 1 tablet (50 mg) at bedtime: For sleep 30 tablet 0   No current facility-administered medications for this visit.    Allergies as of 04/27/2020 - Review Complete 04/27/2020  Allergen Reaction Noted  . Bee venom Anaphylaxis 04/27/2020  . Ibuprofen  Anaphylaxis 09/30/2019  . Strawberry extract Swelling and Rash 11/24/2015  . Timothy grass pollen allergen Anaphylaxis 09/30/2019  . Kiwi extract Hives and Itching 06/28/2016  . Other Hives, Swelling, and Rash 04/27/2020  . Percocet [oxycodone-acetaminophen] Hives, Itching, and Rash 12/20/2017  . Pineapple Hives and Rash 04/27/2020  . Red dye Rash 03/11/2013    Vitals: BP 129/87 (BP Location: Right Arm, Patient Position: Sitting)   Pulse 84   Ht 5\' 2"  (1.575 m)   Wt 222 lb (100.7 kg)   BMI 40.60 kg/m  Last Weight:  Wt Readings from Last 1 Encounters:  04/27/20 222 lb (100.7 kg)   Last Height:   Ht Readings from Last 1 Encounters:  04/27/20 5\' 2"  (1.575 m)     Physical exam: Exam: Gen: NAD, conversant, well nourised, obese, well groomed                     CV: RRR, no MRG. No Carotid Bruits. No peripheral edema, warm, nontender Eyes: Conjunctivae clear without exudates or hemorrhage  Neuro: Detailed Neurologic Exam  Speech:    Speech is normal; fluent and spontaneous with normal comprehension.   Cognition:    The patient is oriented to person, place, and time;     recent and remote memory intact;     language fluent;     normal attention, concentration,     fund of knowledge Cranial Nerves:    The pupils are equal, round, and reactive to light. The fundi are flat. Visual fields are full to finger confrontation. Extraocular movements are intact. Trigeminal sensation is intact and the muscles of mastication are normal. The face is symmetric. The palate elevates in the midline. Hearing intact. Voice is normal. Shoulder shrug is normal. The tongue has normal motion without fasciculations.   Coordination:    No dysmetria or ataxia.   Gait:    Normal native gait  Motor Observation:    No asymmetry, no atrophy, and no involuntary movements noted. Tone:    Normal muscle tone.    Posture:    Posture is normal. normal erect    Strength:    Strength is V/V in the upper and lower limbs.      Sensation: intact to LT     Reflex Exam:  DTR's:    Deep tendon reflexes in the upper and lower extremities are normal bilaterally.   Toes:    The toes are equiv bilaterally.   Clonus:    Clonus is absent.    Assessment/Plan:  25 y.o. female here as requested by ED Couture, Cortni S, PA-C for syncope which is known and followed by [facility]. She has a PMHx of vasovagal syncope and orthostatic intolerance. She had one of her usual episodes, in the setting of hypotension, so the ED sent her here for evaluation of unlikely seizures.    Orders Placed This Encounter  Procedures  . MR BRAIN W WO CONTRAST  . EEG   If EEG neg will order prolonged ambulatory  Patient states this time she had some witnessed convulsive activity. Discussed the following:  [place], patients with seizures are not allowed to drive until they have been seizure-free for six months.    Use caution when using heavy equipment or power tools. Avoid working on ladders or at heights. Take showers  instead of baths. Ensure the water temperature is not too high on the home water heater. Do not go swimming alone. Do not lock yourself  in a room alone (i.e. bathroom). When caring for infants or small children, sit down when holding, feeding, or changing them to minimize risk of injury to the child in the event you have a seizure. Maintain good sleep hygiene. Avoid alcohol.    If patient has another seizure, call 911 and bring them back to the ED if: A.  The seizure lasts longer than 5 minutes.      B.  The patient doesn't wake shortly after the seizure or has new problems such as difficulty seeing, speaking or moving following the seizure C.  The patient was injured during the seizure D.  The patient has a temperature over 102 F (39C) E.  The patient vomited during the seizure and now is having trouble breathing  Per [facility] statutes, patients with seizures are not allowed to drive until they have been seizure-free for six months.  Other recommendations include using caution when using heavy equipment or power tools. Avoid working on ladders or at heights. Take showers instead of baths.  Do not swim alone.  Ensure the water temperature is not too high on the home water heater. Do not go swimming alone. Do not lock yourself in a room alone (i.e. bathroom). When caring for infants or small children, sit down when holding, feeding, or changing them to minimize risk of injury to the child in the event you have a seizure. Maintain good sleep hygiene. Avoid alcohol.  Also recommend adequate sleep, hydration, good diet and minimize stress.  During the Seizure  - First, ensure adequate ventilation and place patients on the floor on their left side  Loosen clothing around the neck and ensure the airway is patent. If the patient is clenching the teeth, do not force the mouth open with any object as this can cause severe damage - Remove all items from the surrounding that can be hazardous. The  patient may be oblivious to what's happening and may not even know what he or she is doing. If the patient is confused and wandering, either gently guide him/her away and block access to outside areas - Reassure the individual and be comforting - Call 911. In most cases, the seizure ends before EMS arrives. However, there are cases when seizures may last over 3 to 5 minutes. Or the individual may have developed breathing difficulties or severe injuries. If a pregnant patient or a person with diabetes develops a seizure, it is prudent to call an ambulance. - Finally, if the patient does not regain full consciousness, then call EMS. Most patients will remain confused for about 45 to 90 minutes after a seizure, so you must use judgment in calling for help. - Avoid restraints but make sure the patient is in a bed with padded side rails - Place the individual in a lateral position with the neck slightly flexed; this will help the saliva drain from the mouth and prevent the tongue from falling backward - Remove all nearby furniture and other hazards from the area - Provide verbal assurance as the individual is regaining consciousness - Provide the patient with privacy if possible - Call for help and start treatment as ordered by the caregiver   fter the Seizure (Postictal Stage)  After a seizure, most patients experience confusion, fatigue, muscle pain and/or a headache. Thus, one should permit the individual to sleep. For the next few days, reassurance is essential. Being calm and helping reorient the person is also of importance.  Most seizures are painless  and end spontaneously. Seizures are not harmful to others but can lead to complications such as stress on the lungs, brain and the heart. Individuals with prior lung problems may develop labored breathing and respiratory distress.    Orders Placed This Encounter  Procedures  . MR BRAIN W WO CONTRAST  . EEG   No orders of the defined types were  placed in this encounter.   Cc: Couture, Timmie Foerster, MD  Naomie Dean, MD  [facility] [address] [place], [place] [address]  Phone [phone] Fax [phone]

## 2020-04-27 NOTE — Telephone Encounter (Signed)
[employer] Berkley Harvey: [phone] (exp. 04/27/20 to 10/23/20)/medicaid order sent to [facility]. They will reach out to the patient to schedule.

## 2020-04-27 NOTE — Patient Instructions (Signed)
MRI of the brain w/wo contrast seizure protocol EEG here in the office Prolonged eeg at home  Per [facility] statutes, patients with seizures are not allowed to drive until they have been seizure-free for six months.    Use caution when using heavy equipment or power tools. Avoid working on ladders or at heights. Take showers instead of baths. Ensure the water temperature is not too high on the home water heater. Do not go swimming alone. Do not lock yourself in a room alone (i.e. bathroom). When caring for infants or small children, sit down when holding, feeding, or changing them to minimize risk of injury to the child in the event you have a seizure. Maintain good sleep hygiene. Avoid alcohol.    If patient has another seizure, call 911 and bring them back to the ED if: A.  The seizure lasts longer than 5 minutes.      B.  The patient doesn't wake shortly after the seizure or has new problems such as difficulty seeing, speaking or moving following the seizure C.  The patient was injured during the seizure D.  The patient has a temperature over 102 F (39C) E.  The patient vomited during the seizure and now is having trouble breathing  Per [facility] statutes, patients with seizures are not allowed to drive until they have been seizure-free for six months.  Other recommendations include using caution when using heavy equipment or power tools. Avoid working on ladders or at heights. Take showers instead of baths.  Do not swim alone.  Ensure the water temperature is not too high on the home water heater. Do not go swimming alone. Do not lock yourself in a room alone (i.e. bathroom). When caring for infants or small children, sit down when holding, feeding, or changing them to minimize risk of injury to the child in the event you have a seizure. Maintain good sleep hygiene. Avoid alcohol.  Also recommend adequate sleep, hydration, good diet and minimize stress.  During the Seizure  -  First, ensure adequate ventilation and place patients on the floor on their left side  Loosen clothing around the neck and ensure the airway is patent. If the patient is clenching the teeth, do not force the mouth open with any object as this can cause severe damage - Remove all items from the surrounding that can be hazardous. The patient may be oblivious to what's happening and may not even know what he or she is doing. If the patient is confused and wandering, either gently guide him/her away and block access to outside areas - Reassure the individual and be comforting - Call 911. In most cases, the seizure ends before EMS arrives. However, there are cases when seizures may last over 3 to 5 minutes. Or the individual may have developed breathing difficulties or severe injuries. If a pregnant patient or a person with diabetes develops a seizure, it is prudent to call an ambulance. - Finally, if the patient does not regain full consciousness, then call EMS. Most patients will remain confused for about 45 to 90 minutes after a seizure, so you must use judgment in calling for help. - Avoid restraints but make sure the patient is in a bed with padded side rails - Place the individual in a lateral position with the neck slightly flexed; this will help the saliva drain from the mouth and prevent the tongue from falling backward - Remove all nearby furniture and other hazards from  the area - Provide verbal assurance as the individual is regaining consciousness - Provide the patient with privacy if possible - Call for help and start treatment as ordered by the caregiver   fter the Seizure (Postictal Stage)  After a seizure, most patients experience confusion, fatigue, muscle pain and/or a headache. Thus, one should permit the individual to sleep. For the next few days, reassurance is essential. Being calm and helping reorient the person is also of importance.  Most seizures are painless and end  spontaneously. Seizures are not harmful to others but can lead to complications such as stress on the lungs, brain and the heart. Individuals with prior lung problems may develop labored breathing and respiratory distress.

## 2020-04-27 NOTE — Telephone Encounter (Signed)
Hi Dr. Lucia Gaskins,  She is scheduled for an EEG with [place] on 03/09.

## 2020-04-27 NOTE — Telephone Encounter (Signed)
I ordered a routine office eeg for her, can you please call and get her scheduled? Thanks!

## 2020-05-11 ENCOUNTER — Other Ambulatory Visit: Payer: Self-pay

## 2020-05-11 ENCOUNTER — Ambulatory Visit
Admission: RE | Admit: 2020-05-11 | Discharge: 2020-05-11 | Disposition: A | Discharge status: Home / Self Care | Payer: Medicaid Other | Source: Ambulatory Visit | Attending: Neurology | Admitting: Neurology

## 2020-05-11 DIAGNOSIS — R55 Syncope and collapse: Secondary | ICD-10-CM

## 2020-05-11 MED ORDER — GADOBENATE DIMEGLUMINE 529 MG/ML IV SOLN
20.0000 mL | Freq: Once | INTRAVENOUS | Status: AC | PRN
  Administered 2020-05-11: 20 mL via INTRAVENOUS

## 2020-05-19 ENCOUNTER — Other Ambulatory Visit: Payer: Self-pay

## 2020-05-19 ENCOUNTER — Ambulatory Visit (INDEPENDENT_AMBULATORY_CARE_PROVIDER_SITE_OTHER): Payer: Medicaid Other | Admitting: Neurology

## 2020-05-19 DIAGNOSIS — R55 Syncope and collapse: Secondary | ICD-10-CM

## 2020-05-24 ENCOUNTER — Telehealth: Payer: Self-pay | Admitting: *Deleted

## 2020-05-24 NOTE — Telephone Encounter (Signed)
-----   Message from Anson Fret, MD sent at 05/20/2020  8:26 PM EST ----- EEG was normal but I recommend a 48 hour eeg. Please ask her if ok to go forward with that thanks

## 2020-05-24 NOTE — Telephone Encounter (Signed)
Called patient and LVM (ok per DPR) advising EEG results are normal. However Dr Lucia Gaskins recommends a 48 hour home EEG. I asked the pt to either call us back or respond through mychart letting us know if she is ok with Dr Lucia Gaskins placing the order. Left office number and hours in message.

## 2020-05-25 NOTE — Telephone Encounter (Signed)
Extended EEG order from completed, signed, and faxed to [facility] along with office note, insurance and demographic information, and routine EEG result .

## 2020-05-26 NOTE — Telephone Encounter (Signed)
Received a fax from [facility]. Referral is being processed and they will reach out to [place] when the patient has been scheduled.

## 2020-06-16 DIAGNOSIS — Z0271 Encounter for disability determination: Secondary | ICD-10-CM

## 2020-07-12 ENCOUNTER — Encounter: Payer: Self-pay | Admitting: Neurology

## 2020-07-21 NOTE — Telephone Encounter (Signed)
Yes fine thanks

## 2020-07-21 NOTE — Telephone Encounter (Signed)
Received 48-hour video EEG results from [facility].   Impression:  This 48-hour awake and asleep ambulatory EEG was within normal limits.  No focal, lateralized or epileptiform features were noted.  There were 0 pushbutton events during the entire EEG tracing. EEG results signed by Geraldine Solar, MD  Copy of results sent to medical records for scanning.

## 2020-07-26 ENCOUNTER — Telehealth: Payer: Self-pay | Admitting: Neurology

## 2020-07-26 NOTE — Telephone Encounter (Signed)
48-hour awake and asleep ambulatory EEG was within normal limits, no focal, lateralized or epileptiform features were noted.

## 2020-07-29 ENCOUNTER — Telehealth (INDEPENDENT_AMBULATORY_CARE_PROVIDER_SITE_OTHER): Payer: BC Managed Care – PPO | Admitting: Neurology

## 2020-07-29 DIAGNOSIS — Z5329 Procedure and treatment not carried out because of patient's decision for other reasons: Secondary | ICD-10-CM

## 2020-07-29 NOTE — Progress Notes (Signed)
NO SHOW fro follow up appointment

## 2020-08-17 IMAGING — CR DG CHEST 2V
2 series · 2 of 2 positions shown · non-contrast
Comparison: Chest radiograph performed 06/29/2016

CLINICAL DATA: Acute onset of cough and fever. Syncope.

EXAM:
CHEST - 2 VIEW

[chest lat]
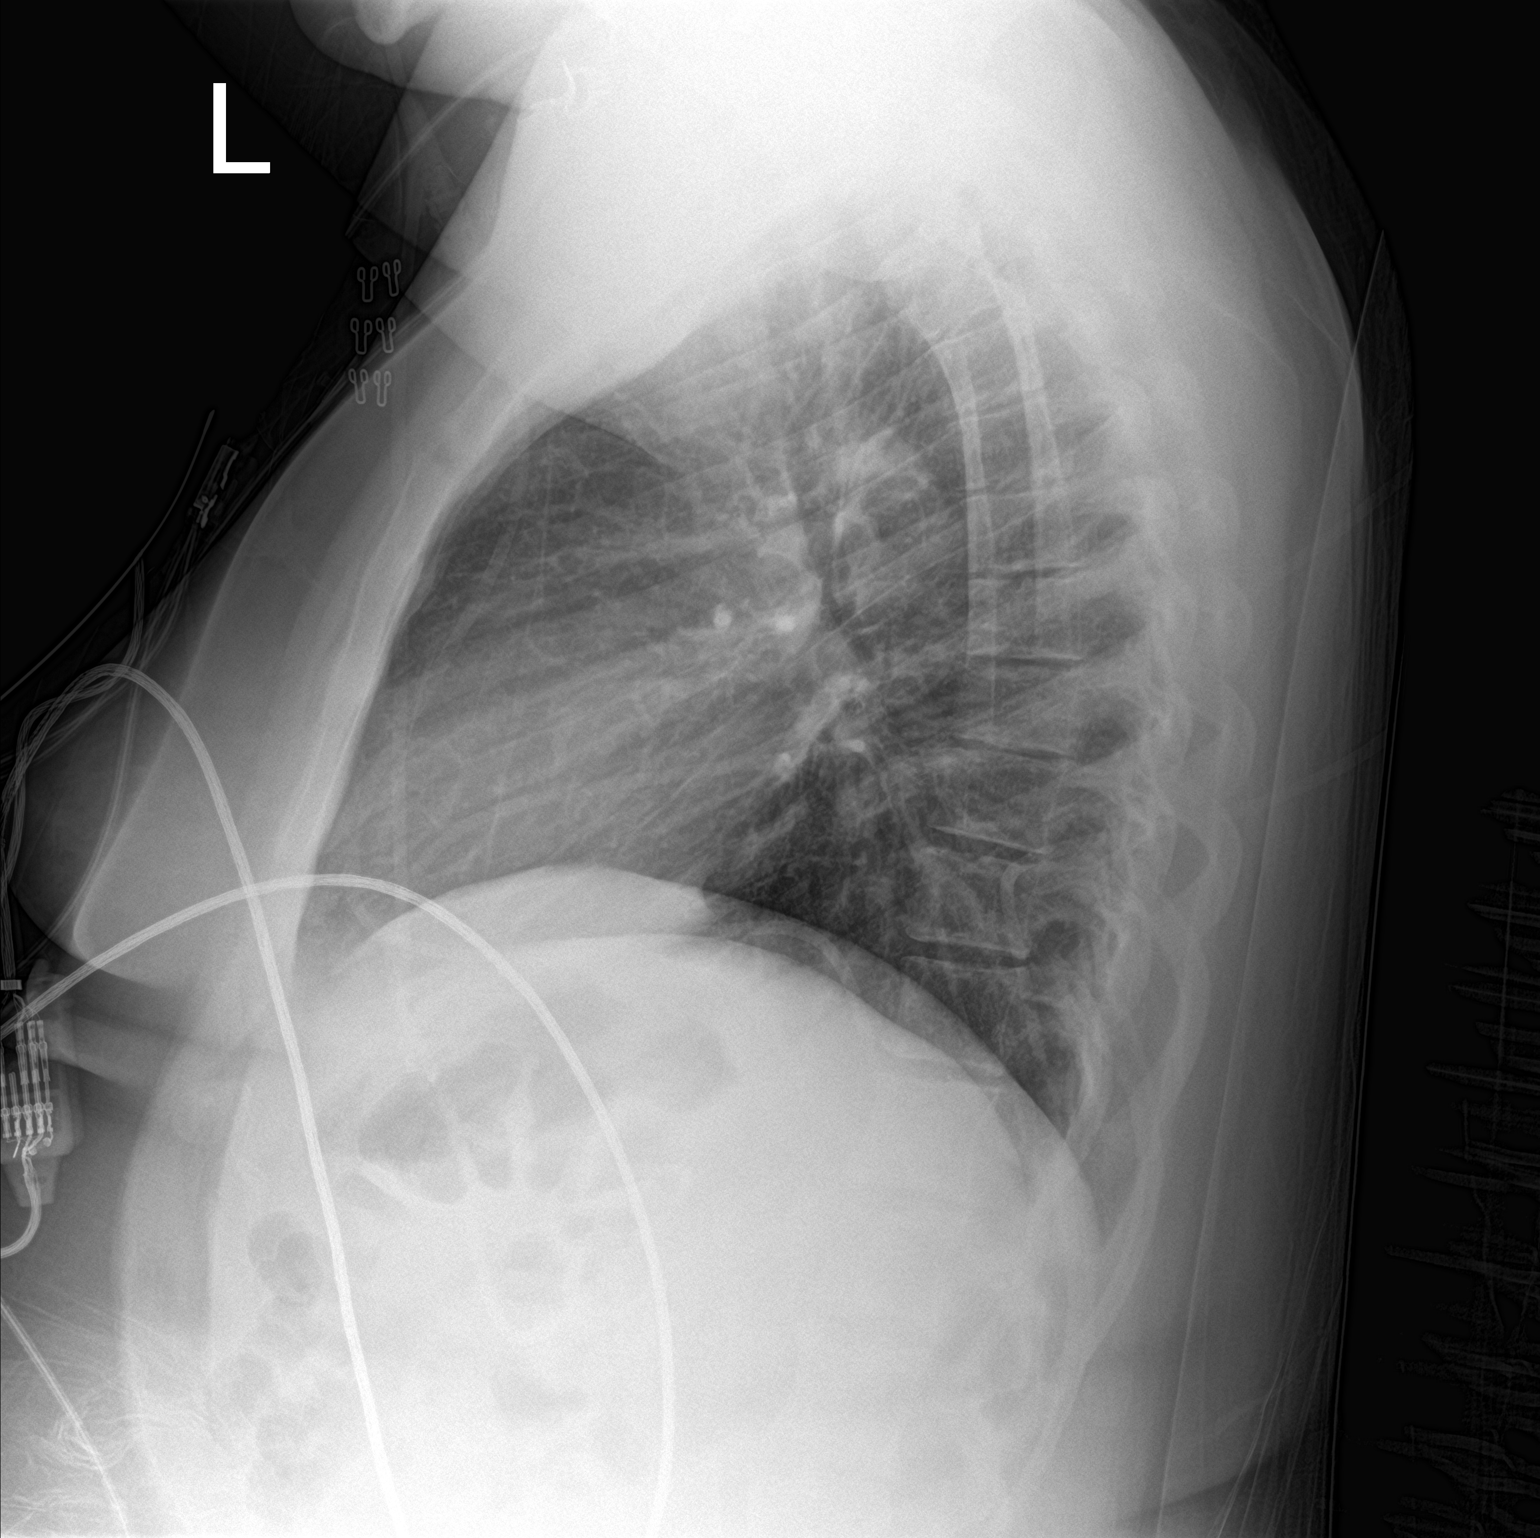

[chest ap]
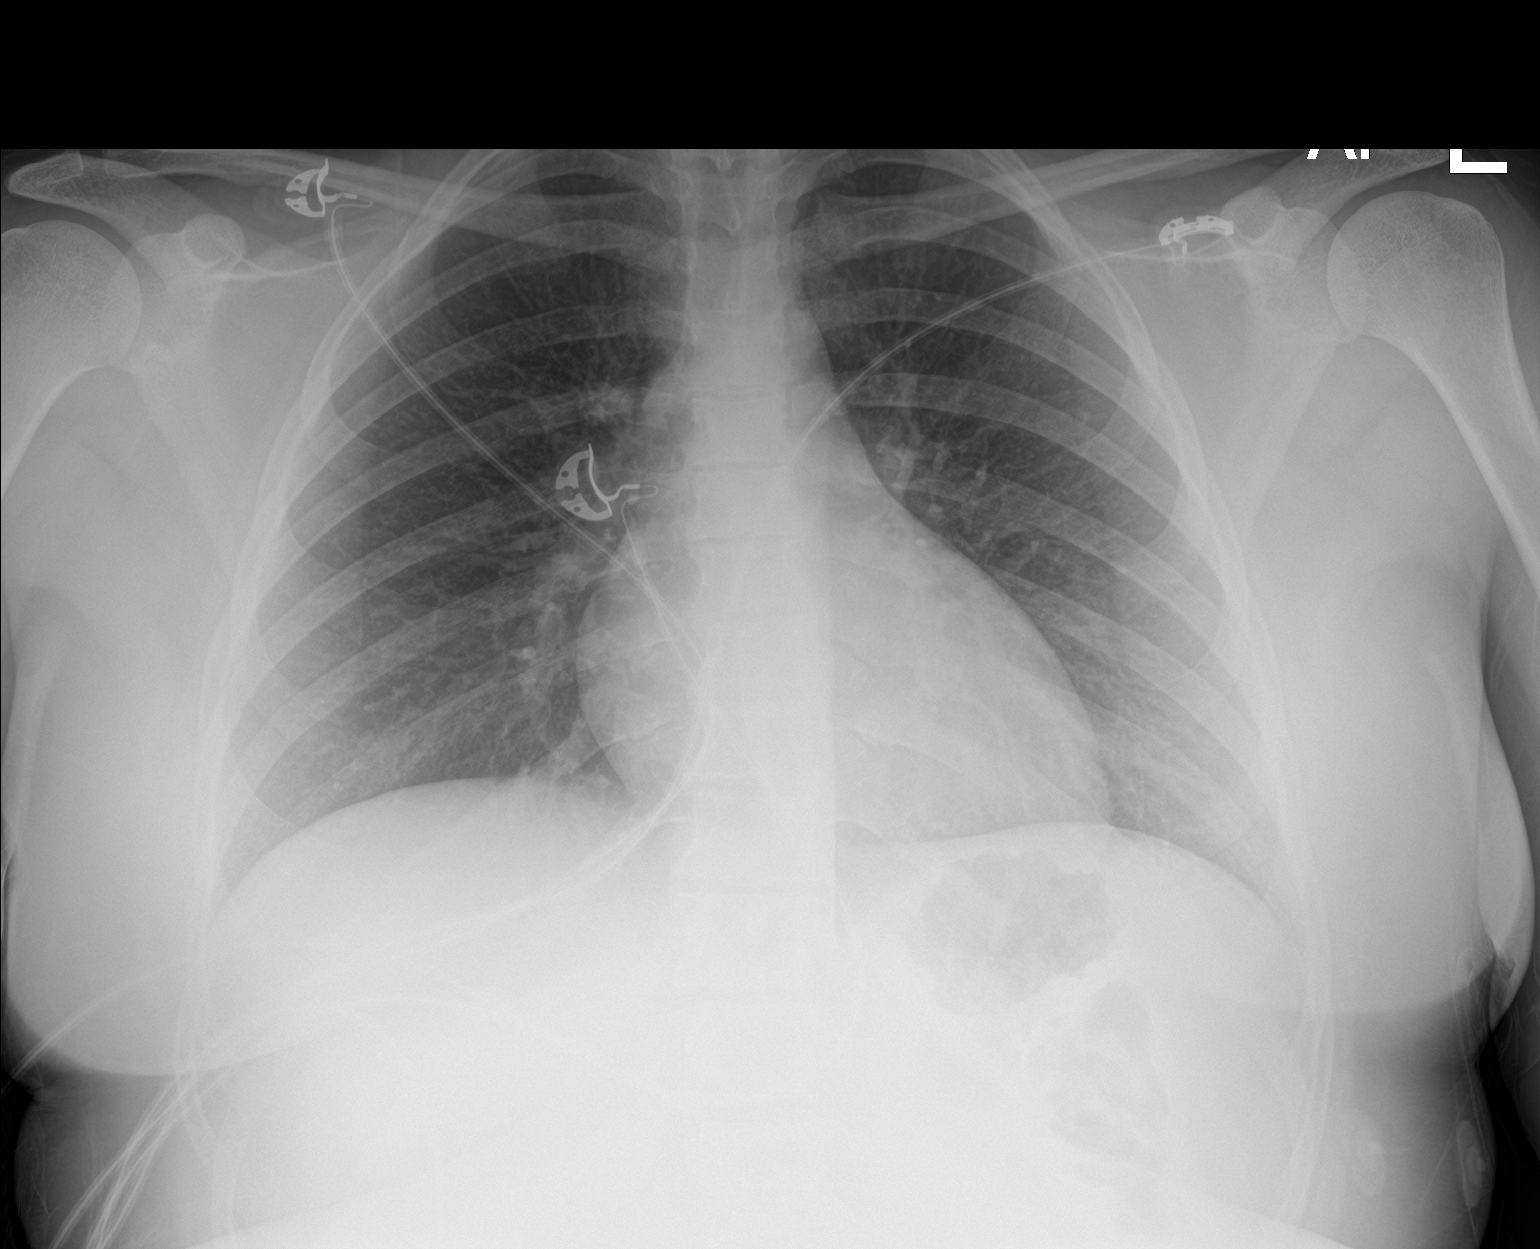

[2 of 2 positions shown; findings below may reference images not displayed]

FINDINGS: The lungs are well-aerated and clear. There is no evidence of focal
opacification, pleural effusion or pneumothorax.

The heart is normal in size; the mediastinal contour is within
normal limits. No acute osseous abnormalities are seen.
IMPRESSION: No acute cardiopulmonary process seen.

## 2022-02-06 ENCOUNTER — Other Ambulatory Visit: Payer: Self-pay

## 2022-02-06 ENCOUNTER — Emergency Department (HOSPITAL_COMMUNITY)
Admission: EM | Admit: 2022-02-06 | Discharge: 2022-02-07 | Discharge status: Against Medical Advice | Payer: Commercial Managed Care - PPO | Attending: Student | Admitting: Student

## 2022-02-06 DIAGNOSIS — R21 Rash and other nonspecific skin eruption: Secondary | ICD-10-CM | POA: Insufficient documentation

## 2022-02-06 DIAGNOSIS — Z5321 Procedure and treatment not carried out due to patient leaving prior to being seen by health care provider: Secondary | ICD-10-CM | POA: Insufficient documentation

## 2022-02-06 DIAGNOSIS — Z8616 Personal history of COVID-19: Secondary | ICD-10-CM | POA: Insufficient documentation

## 2022-02-06 LAB — COMPREHENSIVE METABOLIC PANEL
ALT: 13 U/L (ref 0–44)
AST: 13 U/L — ABNORMAL LOW (ref 15–41)
Albumin: 3.6 g/dL (ref 3.5–5.0)
Alkaline Phosphatase: 49 U/L (ref 38–126)
Anion gap: 13 (ref 5–15)
BUN: 5 mg/dL — ABNORMAL LOW (ref 6–20)
CO2: 26 mmol/L (ref 22–32)
Calcium: 8.8 mg/dL — ABNORMAL LOW (ref 8.9–10.3)
Chloride: 100 mmol/L (ref 98–111)
Creatinine, Ser: 0.83 mg/dL (ref 0.44–1.00)
GFR, Estimated: >60 mL/min (ref >60)
Glucose, Bld: 100 mg/dL — ABNORMAL HIGH (ref 70–99)
Potassium: 3.4 mmol/L — ABNORMAL LOW (ref 3.5–5.1)
Sodium: 139 mmol/L (ref 135–145)
Total Bilirubin: 0.2 mg/dL — ABNORMAL LOW (ref 0.3–1.2)
Total Protein: 6.9 g/dL (ref 6.5–8.1)

## 2022-02-06 LAB — CBC WITH DIFFERENTIAL/PLATELET
Abs Immature Granulocytes: 0.04 10*3/uL (ref 0.00–0.07)
Basophils Absolute: 0.1 10*3/uL (ref 0.0–0.1)
Basophils Relative: 0 %
Eosinophils Absolute: 0.3 10*3/uL (ref 0.0–0.5)
Eosinophils Relative: 3 %
HCT: 36.2 % (ref 36.0–46.0)
Hemoglobin: 12.1 g/dL (ref 12.0–15.0)
Immature Granulocytes: 0 %
Lymphocytes Relative: 32 %
Lymphs Abs: 3.6 10*3/uL (ref 0.7–4.0)
MCH: 31.3 pg (ref 26.0–34.0)
MCHC: 33.4 g/dL (ref 30.0–36.0)
MCV: 93.5 fL (ref 80.0–100.0)
Monocytes Absolute: 0.9 10*3/uL (ref 0.1–1.0)
Monocytes Relative: 8 %
Neutro Abs: 6.3 10*3/uL (ref 1.7–7.7)
Neutrophils Relative %: 57 %
Platelets: 337 10*3/uL (ref 150–400)
RBC: 3.87 MIL/uL (ref 3.87–5.11)
RDW: 12.7 % (ref 11.5–15.5)
WBC: 11.2 10*3/uL — ABNORMAL HIGH (ref 4.0–10.5)
nRBC: 0 % (ref 0.0–0.2)

## 2022-02-06 LAB — I-STAT BETA HCG BLOOD, ED (MC, WL, AP ONLY): I-stat hCG, quantitative: <5 m[IU]/mL (ref <5)

## 2022-02-06 NOTE — ED Provider Triage Note (Signed)
Emergency Medicine Provider Triage Evaluation Note  Kaitlyn Moreno , a 26 y.o. female  was evaluated in triage.  Pt complains of " rapidly progressing" rash to the medial proximal right upper arm x 24 hours.  Initially small red spot now with large lump and associated spreading erythema and "small dark purple spots".  Patient states that she had a history of elevated D-dimer in the past when she had COVID-19 and her mother who is an RN directed to the ED to evaluate for DVT.Marland Kitchen  Review of Systems  Positive: As above Negative:  fevers chills nausea vomiting diarrhea numbness tingling weakness in the hand  Physical Exam  BP 112/65 (BP Location: Left Arm)   Pulse 86   Temp 98.4 F (36.9 C)   Resp 18   SpO2 100%  Gen:   Awake, no distress   Resp:  Normal effort  MSK:   Moves extremities without difficulty  Other:  patchy erythema with small petechiae but proximal medial arm with palpable axillary lymphadenopathy and band sensation in the medial proximal arm  Medical Decision Making  Medically screening exam initiated at 10:42 PM.  Appropriate orders placed.  MADLYNN LUNDEEN was informed that the remainder of the evaluation will be completed by another provider, this initial triage assessment does not replace that evaluation, and the importance of remaining in the ED until their evaluation is complete.  Labs initiated, patient informed no DVT studies available overnight.  This chart was dictated using voice recognition software, Dragon. Despite the best efforts of this provider to proofread and correct errors, errors may still occur which can change documentation meaning.    Paris Lore, PA-C 02/06/22 2250

## 2022-02-06 NOTE — ED Triage Notes (Signed)
Pt states she has a progressively worsening rash to her right arm.  Has been on 2 different anbx (for an unknown recent illness pt states )  Pt states she was seen earlier today at her doc office for same but rash has progressively worsened.  Pt states her mother is a trauma nurse and thinks she needs eval for blood clot in her arm as she has a hx of elevated d-dimers.

## 2022-02-07 LAB — D-DIMER, QUANTITATIVE: D-Dimer, Quant: 0.39 ug/mL-FEU (ref 0.00–0.50)

## 2022-02-07 MED ORDER — PYRIDOSTIGMINE BROMIDE 60 MG PO TABS
60.0000 mg | ORAL_TABLET | Freq: Once | ORAL | Status: AC
  Administered 2022-02-07: 60 mg via ORAL
  Filled 2022-02-07: qty 1

## 2022-02-07 MED ORDER — PYRIDOSTIGMINE BROMIDE 60 MG PO TABS
60.0000 mg | ORAL_TABLET | Freq: Once | ORAL | Status: DC
  Filled 2022-02-07: qty 1

## 2022-02-07 NOTE — ED Notes (Signed)
Called for vitals. No response 

## 2024-02-15 NOTE — Progress Notes (Signed)
 Patient ID: Kaitlyn Moreno is a 28 y.o. female. 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   Subjective  Patient returns to clinic as a follow up today. Last seen on 01/16/2024 for EMG to evaluate weakness, numbness, pain and paresthesias of bilateral upper and lower extremities.  At that time EMG study was negative for peripheral neuropathy or cervical radiculopathy.  On chart review it appears patient has had worsening neck pain with radiation down her bilateral arms.  Per neurosurgery do not suspect that issues are secondary to Diamox or IIH.  Today patient reports intermittent pain which starts in her bilateral shoulders that radiates into her bilateral elbows.  Reports that pain is intermittent throughout the day but has been ongoing since Sunday.  She does report she had a syncopal event on Saturday and is unsure if this is contributed to new onset of symptoms.  Reports that pain is currently rated 4/10 in her neck but increases up to 8/10 when she turns her head quickly.  She has noted some numbness over her bilateral shoulders and feels some subjective weakness in her hands with difficulty using her wheelchair.  She has been taking baclofen which does provide some relief of symptoms.  Denies any fevers, dysuria, rashes or other infectious symptoms.  Prior Treatments: Medications-Medrol  Dosepak: Helpful, baclofen: Helpful, Tylenol : Mildly helpful -04/12/2023: Referred to physical therapy for back, Medrol  Dosepak prescribed  Objective Vitals:   02/15/24 1041  BP: 109/70  Pulse: 78     GENERAL: A/O x3, cooperative, NAD PSYCH: Mood and affect appropriate. SKIN: No rashes or lesions noted on limited skin exam.  PULM: Respirations regular, non-labored. MSK: Full range of motion of cervical spine without reproduction of pain.  No tenderness to palpation of bilateral cervical  paraspinals and bilateral trapezius. NEURO: 5/5 strength throughout bilateral upper extremities, 2+ biceps, brachioradialis and triceps reflexes bilaterally, sensation intact to light touch throughout bilateral upper extremities except decreased sensation over bilateral upper arms.  Negative Hoffmann's bilaterally, tremor noted in bilateral hands.  Below labs were reviewed in the context of MDM: Lab Results  Component Value Date   CREATININE 0.70 12/10/2023    Imaging:  MRI cervical spine 12/17/2023 FINDINGS: Alignment: No substantial subluxation.   Vertebrae: Vertebral body heights are maintained. No marrow signal abnormalities to suggest neoplasm.   Spinal cord: Normal signal.   Craniocervical junction: No focal abnormality.   Degenerative changes: C1-C2: No substantial canal or foraminal stenosis.  C2-C3: No substantial canal or foraminal stenosis. Small central posterior disc osteophyte complex. C3-C4: No substantial canal or foraminal stenosis. Small central posterior disc osteophyte complex that minimally contours the ventral cord.  C4-C5: Small right paracentral posterior disc osteophyte complex with superimposed soft disc component that mildly contours the right ventral cord with overall mild spinal canal narrowing. No substantial foraminal narrowing.  C5-C6: Central posterior disc osteophyte complex that narrows the ventral CSF space without substantially contouring the cord. No substantial foraminal stenosis.  C6-C7: Right paracentral disc osteophyte complex mildly narrows the ventral CSF space without contouring of the ventral cord.  C7-T1: No substantial canal or foraminal stenosis.    Visualized portion of the thoracic spine: No high grade canal stenosis.   IMPRESSION: 1.  No high grade canal or foraminal stenosis within the cervical spine. 2.  No cervical cord signal abnormality. 3.  Mild multilevel cervical spondylosis as described above.  MRI thoracic spine  12/17/2023  FINDINGS: Thoracic spinal cord: Normal signal and contour. Conus medullaris terminates in normal position.   Vertebrae: Vertebral body heights are maintained. No marrow signal abnormality to suggest neoplasm. There are multifocal endplate irregularities and subtle height loss throughout the mid and lower thoracic spine superior endplates without associated marrow edema, favored to represent Schmorl's nodes. There is subtle wedging associated with many of these vertebra.   Degenerative changes: No high-grade canal or foraminal stenosis. There is disc height loss and disc desiccation in the midthoracic spine spanning several levels.   Alignment: No substantial subluxation. There is a mildly exaggerated thoracic kyphosis.   Paraspinal soft tissues: No evidence of edema or focal mass lesion.   IMPRESSION: 1.  No high-grade canal or foraminal stenosis within the thoracic spine. 2.  No thoracic spinal cord signal abnormality. 3.  Multifocal Schmorl's nodes and subtle height loss involving multiple mid thoracic vertebra with exaggerated thoracic kyphosis. 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   MRI lumbar spine 07/02/2023 FINDINGS: Segmentation: Presumed standard anatomy with the inferior-most well developed disc space designated as L5-S1.   Alignment:  Physiologic.   Vertebrae:  No fracture, evidence of discitis, or bone lesion.   Conus medullaris and cauda equina: Conus extends to the L1 level. Conus and cauda equina appear normal.   Paraspinal and other soft tissues: Negative.   Disc levels:   T12-L1 through L4-L5: Unremarkable.   L5-S1: Mild disc desiccation with minimal disc bulge, eccentric to the left. Minor facet hypertrophy. Mild left subarticular recess narrowing without evidence of neural impingement. No canal stenosis. Borderline-mild bilateral foraminal stenosis.   IMPRESSION: Mild degenerative disc disease at L5-S1 with mild left  subarticular recess narrowing and borderline-mild bilateral foraminal stenosis. No canal stenosis at any level.  Lumbar x-ray on 04/12/2023: IMPRESSION:   No acute compression deformity or traumatic malalignment. Disc spaces are maintained. Mild levocurvature centered at L4. No pars defects or spondylolisthesis.   Thoracic x-ray on 04/12/2023: IMPRESSION:   No acute compression deformity or traumatic malalignment. Minimal multilevel endplate Schmorl's node formation with degenerative endplate spurring with mild accentuation of thoracic kyphosis. Findings may be degenerative or related to Scheuermann's disease.   Assessment 1.  Acute on chronic neck pain with radiation of bilateral arms 2.  Chronic midline midthoracic back pain, not addressed 3.  Chronic upper lumbar midline pain, not addressed 4.  Bowel and bladder incontinence, not addressed 5.  Left foot drop, not addressed 6.  Stickler syndrome with associated Schmorl's nodes, scoliosis and kyphosis 7.  Status post bilateral MPFL repair in 2024 with diminished sensation below the level of the knee  Patient presents for evaluation of acute on chronic neck pain which is now started to radiate down her bilateral arms to her elbows.  Unclear cause of her symptoms as previous EMG and MRI cervical spine were without significant findings.  Pain could be potentially myofascial in nature as she has found some relief with baclofen, although she does not have any significant tenderness on exam today.  Discussed treatment options with patient including possible trial of gabapentin versus increasing baclofen with patient electing to increase baclofen to 10 mg 3 times daily.  Will additionally provide some home exercises for cervical spine if this were contributing to her pain as she has not previously done any home exercises for her neck.  She is currently scheduled to follow-up with neurology in January for further evaluation of numbness, weakness and  paresthesias.  Plan - Increase baclofen 10 mg 3 times daily  -  Home exercises provided for neck - Patient to establish with neurology on 04/04/2023

## 2024-03-25 ENCOUNTER — Other Ambulatory Visit (INDEPENDENT_AMBULATORY_CARE_PROVIDER_SITE_OTHER): Payer: Self-pay | Admitting: Pediatric Genetics

## 2024-03-25 DIAGNOSIS — Z8489 Family history of other specified conditions: Secondary | ICD-10-CM

## 2024-03-25 DIAGNOSIS — Q8989 Other specified congenital malformations: Secondary | ICD-10-CM

## 2024-03-25 NOTE — Progress Notes (Signed)
 Mother to pediatric patients known to Dr. Georgianna and Aimee. Mother interested in updated genetic testing for herself for her own medical concerns + to see if variants identified in her children were inherited from her. Referral placed to Dr. Chad Haldeman-Englert.

## 2024-05-28 ENCOUNTER — Ambulatory Visit: Admitting: Medical Genetics
